# Patient Record
Sex: Female | Born: 1970 | Race: White | Hispanic: No | Marital: Single | Smoking: Never smoker
Health system: Southern US, Community
[De-identification: ages and names within clinical notes are randomized; demographics above are authoritative.]

## PROBLEM LIST (undated history)

## (undated) DIAGNOSIS — I10 Essential (primary) hypertension: Secondary | ICD-10-CM

## (undated) DIAGNOSIS — E039 Hypothyroidism, unspecified: Secondary | ICD-10-CM

## (undated) DIAGNOSIS — E669 Obesity, unspecified: Secondary | ICD-10-CM

## (undated) DIAGNOSIS — E785 Hyperlipidemia, unspecified: Secondary | ICD-10-CM

## (undated) HISTORY — DX: Obesity, unspecified: E66.9

## (undated) HISTORY — PX: TONSILLECTOMY: SUR1361

## (undated) HISTORY — PX: CHOLECYSTECTOMY: SHX55

## (undated) HISTORY — DX: Essential (primary) hypertension: I10

## (undated) HISTORY — DX: Hypothyroidism, unspecified: E03.9

## (undated) HISTORY — PX: ANKLE SURGERY: SHX546

## (undated) HISTORY — DX: Hyperlipidemia, unspecified: E78.5

---

## 2009-11-28 ENCOUNTER — Ambulatory Visit: Payer: Self-pay | Admitting: General Practice

## 2010-03-12 ENCOUNTER — Encounter: Admission: RE | Admit: 2010-03-12 | Discharge: 2010-03-12 | Payer: Self-pay | Admitting: Neurosurgery

## 2010-03-28 ENCOUNTER — Encounter: Admission: RE | Admit: 2010-03-28 | Discharge: 2010-03-28 | Payer: Self-pay | Admitting: Neurosurgery

## 2010-05-29 ENCOUNTER — Emergency Department (HOSPITAL_COMMUNITY): Admission: EM | Admit: 2010-05-29 | Discharge: 2010-05-29 | Payer: Self-pay | Admitting: Emergency Medicine

## 2014-12-04 ENCOUNTER — Emergency Department: Payer: Self-pay | Admitting: Emergency Medicine

## 2014-12-06 ENCOUNTER — Other Ambulatory Visit (HOSPITAL_COMMUNITY): Payer: Self-pay | Admitting: Physician Assistant

## 2014-12-06 DIAGNOSIS — M5126 Other intervertebral disc displacement, lumbar region: Secondary | ICD-10-CM

## 2014-12-06 DIAGNOSIS — M5410 Radiculopathy, site unspecified: Secondary | ICD-10-CM

## 2014-12-12 ENCOUNTER — Ambulatory Visit (HOSPITAL_COMMUNITY)
Admission: RE | Admit: 2014-12-12 | Discharge: 2014-12-12 | Disposition: A | Discharge status: Home / Self Care | Payer: 59 | Source: Ambulatory Visit | Attending: Physician Assistant | Admitting: Physician Assistant

## 2014-12-12 DIAGNOSIS — M5410 Radiculopathy, site unspecified: Secondary | ICD-10-CM

## 2014-12-12 DIAGNOSIS — M5126 Other intervertebral disc displacement, lumbar region: Secondary | ICD-10-CM

## 2015-07-27 ENCOUNTER — Encounter: Payer: Self-pay | Admitting: Physician Assistant

## 2015-07-27 ENCOUNTER — Ambulatory Visit: Payer: Self-pay | Admitting: Physician Assistant

## 2015-07-27 VITALS — BP 150/112 | Temp 98.8°F

## 2015-07-27 DIAGNOSIS — J069 Acute upper respiratory infection, unspecified: Secondary | ICD-10-CM

## 2015-07-27 DIAGNOSIS — R030 Elevated blood-pressure reading, without diagnosis of hypertension: Secondary | ICD-10-CM

## 2015-07-27 DIAGNOSIS — J018 Other acute sinusitis: Secondary | ICD-10-CM

## 2015-07-27 DIAGNOSIS — IMO0001 Reserved for inherently not codable concepts without codable children: Secondary | ICD-10-CM

## 2015-07-27 MED ORDER — FLUTICASONE PROPIONATE 50 MCG/ACT NA SUSP: 2.0000 | Freq: Every day | NASAL | Status: DC

## 2015-07-27 MED ORDER — AZITHROMYCIN 250 MG PO TABS: ORAL_TABLET | ORAL | Status: DC

## 2015-07-27 NOTE — Progress Notes (Signed)
S: C/o runny nose and congestion for 3 days, no fever, chills, cp/sob, v/d; mucus is green and thick, cough is sporadic, c/o of facial and dental pain. Earlier in the week she had diarrhea but is better now  Using otc meds: alka seltzer severe cold, zytrec, benadryl, tylenol and motrin  O: PE: vitals w elevated bp, others wnl, perrl eomi, normocephalic, tms dull, nasal mucosa red and swollen, throat injected, neck supple no lymph, lungs c t a, cv rrr, neuro intact  A:  Acute sinusitis, elevated blood pressure without dx of htn   P: zpack, flonase, stop otc meds, work note given, return for bp recheck on Mon or Wed, go to ER if bp getting higher over the weekend, drink fluids, continue regular meds , use otc meds of choice, return if not improving in 5 days, return earlier if worsening

## 2015-07-27 NOTE — Patient Instructions (Signed)
Sinusitis, Adult Sinusitis is redness, soreness, and inflammation of the paranasal sinuses. Paranasal sinuses are air pockets within the bones of your face. They are located beneath your eyes, in the middle of your forehead, and above your eyes. In healthy paranasal sinuses, mucus is able to drain out, and air is able to circulate through them by way of your nose. However, when your paranasal sinuses are inflamed, mucus and air can become trapped. This can allow bacteria and other germs to grow and cause infection. Sinusitis can develop quickly and last only a short time (acute) or continue over a long period (chronic). Sinusitis that lasts for more than 12 weeks is considered chronic. CAUSES Causes of sinusitis include:  Allergies.  Structural abnormalities, such as displacement of the cartilage that separates your nostrils (deviated septum), which can decrease the air flow through your nose and sinuses and affect sinus drainage.  Functional abnormalities, such as when the small hairs (cilia) that line your sinuses and help remove mucus do not work properly or are not present. SIGNS AND SYMPTOMS Symptoms of acute and chronic sinusitis are the same. The primary symptoms are pain and pressure around the affected sinuses. Other symptoms include:  Upper toothache.  Earache.  Headache.  Bad breath.  Decreased sense of smell and taste.  A cough, which worsens when you are lying flat.  Fatigue.  Fever.  Thick drainage from your nose, which often is green and may contain pus (purulent).  Swelling and warmth over the affected sinuses. DIAGNOSIS Your health care provider will perform a physical exam. During your exam, your health care provider may perform any of the following to help determine if you have acute sinusitis or chronic sinusitis:  Look in your nose for signs of abnormal growths in your nostrils (nasal polyps).  Tap over the affected sinus to check for signs of  infection.  View the inside of your sinuses using an imaging device that has a light attached (endoscope). If your health care provider suspects that you have chronic sinusitis, one or more of the following tests may be recommended:  Allergy tests.  Nasal culture. A sample of mucus is taken from your nose, sent to a lab, and screened for bacteria.  Nasal cytology. A sample of mucus is taken from your nose and examined by your health care provider to determine if your sinusitis is related to an allergy. TREATMENT Most cases of acute sinusitis are related to a viral infection and will resolve on their own within 10 days. Sometimes, medicines are prescribed to help relieve symptoms of both acute and chronic sinusitis. These may include pain medicines, decongestants, nasal steroid sprays, or saline sprays. However, for sinusitis related to a bacterial infection, your health care provider will prescribe antibiotic medicines. These are medicines that will help kill the bacteria causing the infection. Rarely, sinusitis is caused by a fungal infection. In these cases, your health care provider will prescribe antifungal medicine. For some cases of chronic sinusitis, surgery is needed. Generally, these are cases in which sinusitis recurs more than 3 times per year, despite other treatments. HOME CARE INSTRUCTIONS  Drink plenty of water. Water helps thin the mucus so your sinuses can drain more easily.  Use a humidifier.  Inhale steam 3-4 times a day (for example, sit in the bathroom with the shower running).  Apply a warm, moist washcloth to your face 3-4 times a day, or as directed by your health care provider.  Use saline nasal sprays to help   moisten and clean your sinuses.  Take medicines only as directed by your health care provider.  If you were prescribed either an antibiotic or antifungal medicine, finish it all even if you start to feel better. SEEK IMMEDIATE MEDICAL CARE IF:  You have  increasing pain or severe headaches.  You have nausea, vomiting, or drowsiness.  You have swelling around your face.  You have vision problems.  You have a stiff neck.  You have difficulty breathing.   This information is not intended to replace advice given to you by your health care provider. Make sure you discuss any questions you have with your health care provider.   Document Released: 09/22/2005 Document Revised: 10/13/2014 Document Reviewed: 10/07/2011 Elsevier Interactive Patient Education 2016 [employer]. Hypertension Hypertension, commonly called high blood pressure, is when the force of blood pumping through your arteries is too strong. Your arteries are the blood vessels that carry blood from your heart throughout your body. A blood pressure reading consists of a higher number over a lower number, such as 110/72. The higher number (systolic) is the pressure inside your arteries when your heart pumps. The lower number (diastolic) is the pressure inside your arteries when your heart relaxes. Ideally you want your blood pressure below 120/80. Hypertension forces your heart to work harder to pump blood. Your arteries may become narrow or stiff. Having untreated or uncontrolled hypertension can cause heart attack, stroke, kidney disease, and other problems. RISK FACTORS Some risk factors for high blood pressure are controllable. Others are not.  Risk factors you cannot control include:   Race. You may be at higher risk if you are African American.  Age. Risk increases with age.  Gender. Men are at higher risk than women before age 65 years. After age 35, women are at higher risk than men. Risk factors you can control include:  Not getting enough exercise or physical activity.  Being overweight.  Getting too much fat, sugar, calories, or salt in your diet.  Drinking too much alcohol. SIGNS AND SYMPTOMS Hypertension does not usually cause signs or symptoms. Extremely  high blood pressure (hypertensive crisis) may cause headache, anxiety, shortness of breath, and nosebleed. DIAGNOSIS To check if you have hypertension, your health care provider will measure your blood pressure while you are seated, with your arm held at the level of your heart. It should be measured at least twice using the same arm. Certain conditions can cause a difference in blood pressure between your right and left arms. A blood pressure reading that is higher than normal on one occasion does not mean that you need treatment. If it is not clear whether you have high blood pressure, you may be asked to return on a different day to have your blood pressure checked again. Or, you may be asked to monitor your blood pressure at home for 1 or more weeks. TREATMENT Treating high blood pressure includes making lifestyle changes and possibly taking medicine. Living a healthy lifestyle can help lower high blood pressure. You may need to change some of your habits. Lifestyle changes may include:  Following the DASH diet. This diet is high in fruits, vegetables, and whole grains. It is low in salt, red meat, and added sugars.  Keep your sodium intake below 2,300 mg per day.  Getting at least 30-45 minutes of aerobic exercise at least 4 times per week.  Losing weight if necessary.  Not smoking.  Limiting alcoholic beverages.  Learning ways to reduce stress. Your health  care provider may prescribe medicine if lifestyle changes are not enough to get your blood pressure under control, and if one of the following is true:  You are 21-64 years of age and your systolic blood pressure is above 140.  You are 77 years of age or older, and your systolic blood pressure is above 150.  Your diastolic blood pressure is above 90.  You have diabetes, and your systolic blood pressure is over 818 or your diastolic blood pressure is over 90.  You have kidney disease and your blood pressure is above  140/90.  You have heart disease and your blood pressure is above 140/90. Your personal target blood pressure may vary depending on your medical conditions, your age, and other factors. HOME CARE INSTRUCTIONS  Have your blood pressure rechecked as directed by your health care provider.   Take medicines only as directed by your health care provider. Follow the directions carefully. Blood pressure medicines must be taken as prescribed. The medicine does not work as well when you skip doses. Skipping doses also puts you at risk for problems.  Do not smoke.   Monitor your blood pressure at home as directed by your health care provider. SEEK MEDICAL CARE IF:   You think you are having a reaction to medicines taken.  You have recurrent headaches or feel dizzy.  You have swelling in your ankles.  You have trouble with your vision. SEEK IMMEDIATE MEDICAL CARE IF:  You develop a severe headache or confusion.  You have unusual weakness, numbness, or feel faint.  You have severe chest or abdominal pain.  You vomit repeatedly.  You have trouble breathing. MAKE SURE YOU:   Understand these instructions.  Will watch your condition.  Will get help right away if you are not doing well or get worse.   This information is not intended to replace advice given to you by your health care provider. Make sure you discuss any questions you have with your health care provider.   Document Released: 09/22/2005 Document Revised: 02/06/2015 Document Reviewed: 07/15/2013 Elsevier Interactive Patient Education 2016 [employer] Your High Blood Pressure Blood pressure is a measurement of how forceful your blood is pressing against the walls of the arteries. Arteries are muscular tubes within the circulatory system. Blood pressure does not stay the same. Blood pressure rises when you are active, excited, or nervous; and it lowers during sleep and relaxation. If the numbers measuring your  blood pressure stay above normal most of the time, you are at risk for health problems. High blood pressure (hypertension) is a long-term (chronic) condition in which blood pressure is elevated. A blood pressure reading is recorded as two numbers, such as 120 over 80 (or 120/80). The first, higher number is called the systolic pressure. It is a measure of the pressure in your arteries as the heart beats. The second, lower number is called the diastolic pressure. It is a measure of the pressure in your arteries as the heart relaxes between beats.  Keeping your blood pressure in a normal range is important to your overall health and prevention of health problems, such as heart disease and stroke. When your blood pressure is uncontrolled, your heart has to work harder than normal. High blood pressure is a very common condition in adults because blood pressure tends to rise with age. Men and women are equally likely to have hypertension but at different times in life. Before age 52, men are more likely to have hypertension.  After 43 years of age, women are more likely to have it. Hypertension is especially common in African Americans. This condition often has no signs or symptoms. The cause of the condition is usually not known. Your caregiver can help you come up with a plan to keep your blood pressure in a normal, healthy range. BLOOD PRESSURE STAGES Blood pressure is classified into four stages: normal, prehypertension, stage 1, and stage 2. Your blood pressure reading will be used to determine what type of treatment, if any, is necessary. Appropriate treatment options are tied to these four stages:  Normal  Systolic pressure (mm Hg): below 120.  Diastolic pressure (mm Hg): below 80. Prehypertension  Systolic pressure (mm Hg): 120 to 139.  Diastolic pressure (mm Hg): 80 to 89. Stage1  Systolic pressure (mm Hg): 140 to 159.  Diastolic pressure (mm Hg): 90 to 99. Stage2  Systolic pressure (mm  Hg): 160 or above.  Diastolic pressure (mm Hg): 100 or above. RISKS RELATED TO HIGH BLOOD PRESSURE Managing your blood pressure is an important responsibility. Uncontrolled high blood pressure can lead to:  A heart attack.  A stroke.  A weakened blood vessel (aneurysm).  Heart failure.  Kidney damage.  Eye damage.  Metabolic syndrome.  Memory and concentration problems. HOW TO MANAGE YOUR BLOOD PRESSURE Blood pressure can be managed effectively with lifestyle changes and medicines (if needed). Your caregiver will help you come up with a plan to bring your blood pressure within a normal range. Your plan should include the following: Education  Read all information provided by your caregivers about how to control blood pressure.  Educate yourself on the latest guidelines and treatment recommendations. New research is always being done to further define the risks and treatments for high blood pressure. Lifestylechanges  Control your weight.  Avoid smoking.  Stay physically active.  Reduce the amount of salt in your diet.  Reduce stress.  Control any chronic conditions, such as high cholesterol or diabetes.  Reduce your alcohol intake. Medicines  Several medicines (antihypertensive medicines) are available, if needed, to bring blood pressure within a normal range. Communication  Review all the medicines you take with your caregiver because there may be side effects or interactions.  Talk with your caregiver about your diet, exercise habits, and other lifestyle factors that may be contributing to high blood pressure.  See your caregiver regularly. Your caregiver can help you create and adjust your plan for managing high blood pressure. RECOMMENDATIONS FOR TREATMENT AND FOLLOW-UP  The following recommendations are based on current guidelines for managing high blood pressure in nonpregnant adults. Use these recommendations to identify the proper follow-up period or  treatment option based on your blood pressure reading. You can discuss these options with your caregiver.  Systolic pressure of 948 to 546 or diastolic pressure of 80 to 89: Follow up with your caregiver as directed.  Systolic pressure of 270 to 350 or diastolic pressure of 90 to 100: Follow up with your caregiver within 2 months.  Systolic pressure above 093 or diastolic pressure above 818: Follow up with your caregiver within 1 month.  Systolic pressure above 299 or diastolic pressure above 371: Consider antihypertensive therapy; follow up with your caregiver within 1 week.  Systolic pressure above 696 or diastolic pressure above 789: Begin antihypertensive therapy; follow up with your caregiver within 1 week.   This information is not intended to replace advice given to you by your health care provider. Make sure you discuss any questions you  have with your health care provider.   Document Released: 06/16/2012 Document Reviewed: 06/16/2012 Elsevier Interactive Patient Education [employer].

## 2015-07-30 ENCOUNTER — Ambulatory Visit: Payer: Self-pay | Admitting: Physician Assistant

## 2015-07-30 ENCOUNTER — Encounter: Payer: Self-pay | Admitting: Physician Assistant

## 2015-07-30 VITALS — BP 150/98

## 2015-07-30 DIAGNOSIS — Z0130 Encounter for examination of blood pressure without abnormal findings: Secondary | ICD-10-CM

## 2015-07-30 NOTE — Progress Notes (Signed)
S: recheck of bp, states cold sx are a little better but still gets headache from coughing, denies cp/sob/dizziness  O: bp still elevated but a little better, 150/98; lungs c t a, cv rrr, neuro intact  A: elevated bp without diagnoses of htn  P: recheck bp on Thursday morning, if still elevated will start pt on medication

## 2015-08-02 ENCOUNTER — Encounter: Payer: Self-pay | Admitting: Physician Assistant

## 2015-08-02 ENCOUNTER — Ambulatory Visit: Payer: Self-pay | Admitting: Physician Assistant

## 2015-08-02 VITALS — BP 140/90

## 2015-08-02 DIAGNOSIS — I10 Essential (primary) hypertension: Secondary | ICD-10-CM

## 2015-08-02 NOTE — Progress Notes (Signed)
S: here for bp check, has gone down to 140/90, pt is still coughing but feeling a little better, no cp/sob  O: bp 140/90, lungs c t a, cv rrr  A: recheck uri and bp  P: recheck bp next week, if still elevated will start on medication

## 2015-08-08 ENCOUNTER — Encounter: Payer: Self-pay | Admitting: Physician Assistant

## 2015-08-08 ENCOUNTER — Ambulatory Visit: Payer: Self-pay | Admitting: Physician Assistant

## 2015-08-08 VITALS — BP 124/90 | Temp 98.0°F

## 2015-08-08 DIAGNOSIS — J018 Other acute sinusitis: Secondary | ICD-10-CM

## 2015-08-08 MED ORDER — AMOXICILLIN 875 MG PO TABS: 875.0000 mg | ORAL_TABLET | Freq: Two times a day (BID) | ORAL | Status: DC

## 2015-08-08 NOTE — Progress Notes (Signed)
S: here for bp recheck, still coughing and having a lot of drainage, no fever/chills, mucus is green in the mornings and then clears throughout the day, no cp/sob  O: bp 124/90, improved, tms clear, nasal mucosa grossly red and swollen, throat wnl, neck supple no lymph, lungs c t a, cv rrr, cough is dry  A: acute sinusitis, bp recheck  P: amoxil 875mg  bid x 10d, recheck bp in 1 week,

## 2015-08-16 ENCOUNTER — Encounter: Payer: Self-pay | Admitting: Physician Assistant

## 2015-08-16 ENCOUNTER — Ambulatory Visit: Payer: Self-pay | Admitting: Physician Assistant

## 2015-08-16 VITALS — BP 122/88 | Temp 98.8°F

## 2015-08-16 DIAGNOSIS — J209 Acute bronchitis, unspecified: Secondary | ICD-10-CM

## 2015-08-16 DIAGNOSIS — R059 Cough, unspecified: Secondary | ICD-10-CM

## 2015-08-16 DIAGNOSIS — R05 Cough: Secondary | ICD-10-CM

## 2015-08-16 MED ORDER — BENZONATATE 200 MG PO CAPS: 200.0000 mg | ORAL_CAPSULE | Freq: Two times a day (BID) | ORAL | Status: DC | PRN

## 2015-08-16 MED ORDER — METHYLPREDNISOLONE 4 MG PO TBPK: ORAL_TABLET | Freq: Every morning | ORAL | Status: DC

## 2015-08-16 MED ORDER — ALBUTEROL SULFATE HFA 108 (90 BASE) MCG/ACT IN AERS: 2.0000 | INHALATION_SPRAY | Freq: Four times a day (QID) | RESPIRATORY_TRACT | Status: DC | PRN

## 2015-08-16 NOTE — Progress Notes (Signed)
S: continued cough, no fever/chills, mucus is clear, still taking amoxil, no cp/sob, cough is spasmodic, using otc meds without relief, cough drops, has had zpack, now amoxil   O: vitals wnl, nad, ENT wnl, neck supple no lymph, lungs c t a, cv rrr, cough is dry and hacking  A: acute bronchitis  P: albuterol inhaler, medrol dose pack, tessalon perls

## 2015-08-23 ENCOUNTER — Other Ambulatory Visit
Admission: RE | Admit: 2015-08-23 | Discharge: 2015-08-23 | Disposition: A | Discharge status: Home / Self Care | Payer: 59 | Source: Ambulatory Visit | Attending: Certified Nurse Midwife | Admitting: Certified Nurse Midwife

## 2015-08-23 DIAGNOSIS — R300 Dysuria: Secondary | ICD-10-CM | POA: Insufficient documentation

## 2015-08-23 LAB — URINALYSIS COMPLETE WITH MICROSCOPIC (ARMC ONLY)
BILIRUBIN URINE: NEGATIVE
GLUCOSE, UA: NEGATIVE mg/dL
HGB URINE DIPSTICK: NEGATIVE
Ketones, ur: NEGATIVE mg/dL
NITRITE: NEGATIVE
Protein, ur: NEGATIVE mg/dL
SPECIFIC GRAVITY, URINE: 1.014 (ref 1.005–1.030)
pH: 6 (ref 5.0–8.0)

## 2015-08-25 ENCOUNTER — Other Ambulatory Visit: Payer: Self-pay | Admitting: Certified Nurse Midwife

## 2015-08-25 LAB — URINE CULTURE: Culture: >=100000

## 2015-09-11 ENCOUNTER — Encounter: Payer: Self-pay | Admitting: Physician Assistant

## 2015-09-11 ENCOUNTER — Ambulatory Visit: Payer: Self-pay | Admitting: Physician Assistant

## 2015-09-11 VITALS — BP 130/90 | HR 76 | Temp 98.6°F

## 2015-09-11 DIAGNOSIS — J018 Other acute sinusitis: Secondary | ICD-10-CM

## 2015-09-11 MED ORDER — FLUTICASONE PROPIONATE 50 MCG/ACT NA SUSP: 2.0000 | Freq: Every day | NASAL | Status: DC

## 2015-09-11 MED ORDER — CEFDINIR 300 MG PO CAPS: 300.0000 mg | ORAL_CAPSULE | Freq: Two times a day (BID) | ORAL | Status: DC

## 2015-09-11 MED ORDER — FLUCONAZOLE 150 MG PO TABS: 150.0000 mg | ORAL_TABLET | Freq: Once | ORAL | Status: DC

## 2015-09-11 NOTE — Progress Notes (Signed)
S: C/o runny nose and congestion for 6 days, no fever, chills, cp/sob, v/d; mucus is green and thick, cough is sporadic, c/o of facial and dental pain.   Using otc meds:   O: PE: vitals wnl, nad, perrl eomi, normocephalic, tms dull, nasal mucosa red and swollen, throat injected, neck supple no lymph, lungs c t a, cv rrr, neuro intact  A:  Acute sinusitis   P: doxy 100mg  bid x 10d, flonase, diflucan if needed, change allergy med to allegra instead of zyrtec, drink fluids, continue regular meds , use otc meds of choice, return if not improving in 5 days, return earlier if worsening

## 2015-10-12 ENCOUNTER — Ambulatory Visit: Payer: Self-pay | Admitting: Physician Assistant

## 2015-10-12 ENCOUNTER — Encounter: Payer: Self-pay | Admitting: Physician Assistant

## 2015-10-12 VITALS — BP 130/98 | HR 60 | Temp 98.6°F

## 2015-10-12 DIAGNOSIS — J0181 Other acute recurrent sinusitis: Secondary | ICD-10-CM

## 2015-10-12 MED ORDER — CLARITHROMYCIN ER 500 MG PO TB24: 1000.0000 mg | ORAL_TABLET | Freq: Every day | ORAL | Status: DC

## 2015-10-12 MED ORDER — FLUCONAZOLE 150 MG PO TABS: 150.0000 mg | ORAL_TABLET | Freq: Once | ORAL | Status: DC

## 2015-10-12 NOTE — Progress Notes (Signed)
S: c/o continued sinus congestion, yellow mucus, cough, cough is better, states she feels better she just can't get rid of sinus drianage, no fever/chills/cp/sob, has been on zpack, amoxil, omnicef, keflex, then doxy in past 3 months,   O: vitals wnl, nad, tms dull, nasal mucosa grossly red and swollen, tender at max sinus, throat wnl, neck supple no lymph, lungs c t a, cv rrr  A: recurrent sinusitis  P: biaxin xl , if not better after this medication, refer to ENT

## 2017-01-05 ENCOUNTER — Encounter (HOSPITAL_COMMUNITY): Payer: Self-pay | Admitting: Emergency Medicine

## 2017-01-05 ENCOUNTER — Emergency Department (HOSPITAL_COMMUNITY): Payer: 59

## 2017-01-05 ENCOUNTER — Emergency Department (HOSPITAL_COMMUNITY)
Admission: EM | Admit: 2017-01-05 | Discharge: 2017-01-06 | Disposition: A | Discharge status: Home / Self Care | Payer: 59 | Attending: Emergency Medicine | Admitting: Emergency Medicine

## 2017-01-05 DIAGNOSIS — R0789 Other chest pain: Secondary | ICD-10-CM | POA: Insufficient documentation

## 2017-01-05 DIAGNOSIS — D3500 Benign neoplasm of unspecified adrenal gland: Secondary | ICD-10-CM | POA: Insufficient documentation

## 2017-01-05 DIAGNOSIS — Z79899 Other long term (current) drug therapy: Secondary | ICD-10-CM | POA: Diagnosis not present

## 2017-01-05 DIAGNOSIS — R079 Chest pain, unspecified: Secondary | ICD-10-CM | POA: Diagnosis not present

## 2017-01-05 DIAGNOSIS — D3502 Benign neoplasm of left adrenal gland: Secondary | ICD-10-CM

## 2017-01-05 LAB — CBC WITH DIFFERENTIAL/PLATELET
Basophils Absolute: 0 10*3/uL (ref 0.0–0.1)
Basophils Relative: 0 %
EOS PCT: 2 %
Eosinophils Absolute: 0.2 10*3/uL (ref 0.0–0.7)
HCT: 42.5 % (ref 36.0–46.0)
HEMOGLOBIN: 14.1 g/dL (ref 12.0–15.0)
LYMPHS PCT: 38 %
Lymphs Abs: 3.3 10*3/uL (ref 0.7–4.0)
MCH: 30.2 pg (ref 26.0–34.0)
MCHC: 33.2 g/dL (ref 30.0–36.0)
MCV: 91 fL (ref 78.0–100.0)
MONO ABS: 0.5 10*3/uL (ref 0.1–1.0)
MONOS PCT: 6 %
NEUTROS ABS: 4.6 10*3/uL (ref 1.7–7.7)
Neutrophils Relative %: 54 %
Platelets: 234 10*3/uL (ref 150–400)
RBC: 4.67 MIL/uL (ref 3.87–5.11)
RDW: 13.4 % (ref 11.5–15.5)
WBC: 8.5 10*3/uL (ref 4.0–10.5)

## 2017-01-05 LAB — BASIC METABOLIC PANEL
ANION GAP: 9 (ref 5–15)
BUN: 10 mg/dL (ref 6–20)
CHLORIDE: 104 mmol/L (ref 101–111)
CO2: 24 mmol/L (ref 22–32)
Calcium: 7.9 mg/dL — ABNORMAL LOW (ref 8.9–10.3)
Creatinine, Ser: 0.75 mg/dL (ref 0.44–1.00)
GFR calc non Af Amer: >60 mL/min (ref >60)
Glucose, Bld: 102 mg/dL — ABNORMAL HIGH (ref 65–99)
POTASSIUM: 3.9 mmol/L (ref 3.5–5.1)
SODIUM: 137 mmol/L (ref 135–145)

## 2017-01-05 LAB — TROPONIN I

## 2017-01-05 NOTE — ED Triage Notes (Signed)
Pt c/o intermittent cp to under left breast radiating up to left shoulder. States it is sharp and aching. Worse with deep breathing. Some movement makes it worse.

## 2017-01-05 NOTE — ED Provider Notes (Signed)
[facility] Provider Note   CSN: [id number] Arrival date & time: 01/05/17  1738  By signing my name below, I, Margit Banda, attest that this documentation has been prepared under the direction and in the presence of Merryl Hacker, MD. Electronically Signed: Margit Banda, ED Scribe. 01/05/17. 11:51 PM.   History   Chief Complaint Chief Complaint  Patient presents with  . Chest Pain    HPI Jasmine Wallace is a 46 y.o. female who presents to the Emergency Department complaining of constant, radiating, abdominal pain since 5:30 am on 01/05/17. Breathing exacerbates her pain causing it to extend upwards to her chest. Pt states Ibuprofen mildly alleviates her pain. Pt rates pain as a 5/10 severity. No recent injuries or traumas. Pt notes taking benadryl daily for allergies. She denies no nausea, vomiting, diarrhea, and any other associated sx at this time.    The history is provided by the patient. No language interpreter was used.    History reviewed. No pertinent past medical history.  There are no active problems to display for this patient.   Past Surgical History:  Procedure Laterality Date  . ANKLE SURGERY    . CHOLECYSTECTOMY    . TONSILLECTOMY      OB History    No data available       Home Medications    Prior to Admission medications   Medication Sig Start Date End Date Taking? Authorizing Provider  acetaminophen (TYLENOL) 500 MG tablet Take 500 mg by mouth every 6 (six) hours as needed.    Historical Provider, MD  albuterol (PROVENTIL HFA;VENTOLIN HFA) 108 (90 BASE) MCG/ACT inhaler Inhale 2 puffs into the lungs every 6 (six) hours as needed for wheezing or shortness of breath. 08/16/15   Versie Starks, PA-C  cefdinir (OMNICEF) 300 MG capsule Take 1 capsule (300 mg total) by mouth 2 (two) times daily. Patient not taking: Reported on 10/12/2015 09/11/15   Versie Starks, PA-C  cetirizine (ZYRTEC) 10 MG tablet Take 10 mg by mouth daily. Reported on  10/12/2015    Historical Provider, MD  clarithromycin (BIAXIN XL) 500 MG 24 hr tablet Take 2 tablets (1,000 mg total) by mouth daily. 10/12/15   Versie Starks, PA-C  fexofenadine (ALLEGRA) 180 MG tablet Take 180 mg by mouth daily.    Historical Provider, MD  fluconazole (DIFLUCAN) 150 MG tablet Take 1 tablet (150 mg total) by mouth once. 10/12/15   Versie Starks, PA-C  fluticasone (FLONASE) 50 MCG/ACT nasal spray Place 2 sprays into both nostrils daily. 09/11/15   Versie Starks, PA-C  omeprazole (PRILOSEC) 20 MG capsule Take 1 capsule (20 mg total) by mouth daily. 01/06/17   Merryl Hacker, MD  traMADol (ULTRAM) 50 MG tablet Take 1 tablet (50 mg total) by mouth every 6 (six) hours as needed. 01/06/17   Merryl Hacker, MD    Family History History reviewed. No pertinent family history.  Social History Social History  Substance Use Topics  . Smoking status: Never Smoker  . Smokeless tobacco: Never Used  . Alcohol use No     Allergies   Patient has no known allergies.   Review of Systems Review of Systems  Constitutional: Negative for fever.  Respiratory: Negative for cough and shortness of breath.   Cardiovascular: Positive for chest pain.  Gastrointestinal: Positive for abdominal pain. Negative for diarrhea, nausea and vomiting.  All other systems reviewed and are negative.    Physical Exam Updated Vital Signs  BP (!) 155/78 (BP Location: Right Arm)   Pulse 84   Temp 98.7 F (37.1 C) (Oral)   Resp 18   Ht 5\' 3"  (1.6 m)   Wt 300 lb (136.1 kg)   LMP 12/30/2016   SpO2 98%   BMI 53.14 kg/m   Physical Exam  Constitutional: She is oriented to person, place, and time. She appears well-developed and well-nourished.  Diabetes, no acute distress  HENT:  Head: Normocephalic and atraumatic.  Cardiovascular: Normal rate, regular rhythm and normal heart sounds.   Pulmonary/Chest: Effort normal. No respiratory distress. She has no wheezes. She exhibits no tenderness.  Abdominal:  Soft. Bowel sounds are normal. There is tenderness. There is no rebound and no guarding.  Lower rib cage, left upper abdominal venous palpation, no rebound or guarding  Musculoskeletal: She exhibits no edema.  Neurological: She is alert and oriented to person, place, and time.  Skin: Skin is warm and dry.  Psychiatric: She has a normal mood and affect.  Nursing note and vitals reviewed.    ED Treatments / Results  DIAGNOSTIC STUDIES: Oxygen Saturation is 100% on RA, normal by my interpretation.   COORDINATION OF CARE: 11:51 PM-Discussed next steps with pt. Pt verbalized understanding and is agreeable with the plan.    Labs (all labs ordered are listed, but only abnormal results are displayed) Labs Reviewed  BASIC METABOLIC PANEL - Abnormal; Notable for the following:       Result Value   Glucose, Bld 102 (*)    Calcium 7.9 (*)    All other components within normal limits  D-DIMER, QUANTITATIVE (NOT AT [facility]) - Abnormal; Notable for the following:    D-Dimer, Quant 1.03 (*)    All other components within normal limits  CBC WITH DIFFERENTIAL/PLATELET  TROPONIN I  HEPATIC FUNCTION PANEL  LIPASE, BLOOD    EKG  EKG Interpretation  Date/Time:  Monday January 05 2017 17:50:13 EDT Ventricular Rate:  77 PR Interval:  136 QRS Duration: 88 QT Interval:  400 QTC Calculation: 452 R Axis:   26 Text Interpretation:  Normal sinus rhythm Normal ECG Confirmed by Dina Rich  MD, COURTNEY ([id number]) on 01/05/2017 11:36:37 PM       Radiology Dg Chest 2 View  Result Date: 01/05/2017 CLINICAL DATA:  Left chest pain. EXAM: CHEST  2 VIEW COMPARISON:  None. FINDINGS: The heart size and mediastinal contours are within normal limits. Both lungs are clear. The visualized skeletal structures are unremarkable. IMPRESSION: No active cardiopulmonary disease. Electronically Signed   By: Abelardo Diesel M.D.   On: 01/05/2017 18:18   Ct Angio Chest Pe W And/or Wo Contrast  Result Date: 01/06/2017 CLINICAL  DATA:  46 y/o F; pleuritic chest pain with positive D-dimer. EXAM: CT ANGIOGRAPHY CHEST WITH CONTRAST TECHNIQUE: Multidetector CT imaging of the chest was performed using the standard protocol during bolus administration of intravenous contrast. Multiplanar CT image reconstructions and MIPs were obtained to evaluate the vascular anatomy. CONTRAST:  100 cc Omnipaque 370 COMPARISON:  None. FINDINGS: Cardiovascular: Satisfactory opacification of the pulmonary arteries to the segmental level. No evidence of pulmonary embolism. Normal heart size. No pericardial effusion. Mediastinum/Nodes: No enlarged mediastinal, hilar, or axillary lymph nodes. Thyroid gland, trachea, and esophagus demonstrate no significant findings. Lungs/Pleura: Lungs are clear. No pleural effusion or pneumothorax. Upper Abdomen: 18 mm and -3 HU left adrenal nodule compatible with adrenal adenoma. Musculoskeletal: No chest wall abnormality. Mild multilevel degenerative changes of the spine. Review of the MIP images confirms the  above findings. IMPRESSION: 1. No evidence of pulmonary embolus. 2. No acute cardiopulmonary process identified. 3. 18 mm left adrenal adenoma. Electronically Signed   By: Kristine Garbe M.D.   On: 01/06/2017 02:50    Procedures Procedures (including critical care time)  Medications Ordered in ED Medications  oxyCODONE-acetaminophen (PERCOCET/ROXICET) 5-325 MG per tablet 1 tablet (1 tablet Oral Given 01/06/17 0022)  iopamidol (ISOVUE-370) 76 % injection 100 mL (100 mLs Intravenous Contrast Given 01/06/17 0205)     Initial Impression / Assessment and Plan / ED Course  I have reviewed the triage vital signs and the nursing notes.  Pertinent labs & imaging results that were available during my care of the patient were reviewed by me and considered in my medical decision making (see chart for details).     She presents with pain that radiates into her chest. Initially had a chest pain evaluation started  in triage. Her EKG and troponin are reassuring. Pain is pleuritic in nature. She also has some minimal abdominal tenderness on exam. Abdominal labs were obtained. D-dimer was also obtained. D-dimer is positive. CT angiogram of the chest obtained. Abdominal lab work is reassuring. Patient improved with pain medication. CT scan shows no evidence of PE but does show an incidental left adrenal adenoma. Unclear the significance of this at this time. I did discuss with the patient this finding. Will discharge home with pain medication. Follow-up cardiology primary physician.  After history, exam, and medical workup I feel the patient has been appropriately medically screened and is safe for discharge home. Pertinent diagnoses were discussed with the patient. Patient was given return precautions.   Final Clinical Impressions(s) / ED Diagnoses   Final diagnoses:  Atypical chest pain  Adenoma of left adrenal gland    New Prescriptions New Prescriptions   OMEPRAZOLE (PRILOSEC) 20 MG CAPSULE    Take 1 capsule (20 mg total) by mouth daily.   TRAMADOL (ULTRAM) 50 MG TABLET    Take 1 tablet (50 mg total) by mouth every 6 (six) hours as needed.   I personally performed the services described in this documentation, which was scribed in my presence. The recorded information has been reviewed and is accurate.     Merryl Hacker, MD 01/06/17 [phone]

## 2017-01-06 ENCOUNTER — Emergency Department (HOSPITAL_COMMUNITY): Payer: 59

## 2017-01-06 DIAGNOSIS — D3500 Benign neoplasm of unspecified adrenal gland: Secondary | ICD-10-CM | POA: Diagnosis not present

## 2017-01-06 DIAGNOSIS — R079 Chest pain, unspecified: Secondary | ICD-10-CM | POA: Diagnosis not present

## 2017-01-06 DIAGNOSIS — Z79899 Other long term (current) drug therapy: Secondary | ICD-10-CM | POA: Diagnosis not present

## 2017-01-06 DIAGNOSIS — R0789 Other chest pain: Secondary | ICD-10-CM | POA: Diagnosis not present

## 2017-01-06 LAB — HEPATIC FUNCTION PANEL
ALK PHOS: 101 U/L (ref 38–126)
ALT: 23 U/L (ref 14–54)
AST: 23 U/L (ref 15–41)
Albumin: 3.9 g/dL (ref 3.5–5.0)
BILIRUBIN DIRECT: 0.1 mg/dL (ref 0.1–0.5)
BILIRUBIN TOTAL: 0.6 mg/dL (ref 0.3–1.2)
Indirect Bilirubin: 0.5 mg/dL (ref 0.3–0.9)
Total Protein: 7.3 g/dL (ref 6.5–8.1)

## 2017-01-06 LAB — D-DIMER, QUANTITATIVE (NOT AT ARMC): D DIMER QUANT: 1.03 ug{FEU}/mL — AB (ref 0.00–0.50)

## 2017-01-06 LAB — LIPASE, BLOOD: Lipase: 24 U/L (ref 11–51)

## 2017-01-06 MED ORDER — IOPAMIDOL (ISOVUE-370) INJECTION 76%
100.0000 mL | Freq: Once | INTRAVENOUS | Status: AC | PRN
  Administered 2017-01-06: 100 mL via INTRAVENOUS

## 2017-01-06 MED ORDER — TRAMADOL HCL 50 MG PO TABS: 50.0000 mg | ORAL_TABLET | Freq: Four times a day (QID) | ORAL | 0 refills | Status: DC | PRN

## 2017-01-06 MED ORDER — OMEPRAZOLE 20 MG PO CPDR: 20.0000 mg | DELAYED_RELEASE_CAPSULE | Freq: Every day | ORAL | 0 refills | Status: DC

## 2017-01-06 MED ORDER — OXYCODONE-ACETAMINOPHEN 5-325 MG PO TABS
1.0000 | ORAL_TABLET | Freq: Once | ORAL | Status: AC
Start: 2017-01-06 — End: 2017-01-06
  Administered 2017-01-06: 1 via ORAL
  Filled 2017-01-06: qty 1

## 2017-01-06 NOTE — ED Notes (Signed)
Pt ambulatory to waiting room. Pt verbalized understanding of discharge instructions.   

## 2017-01-06 NOTE — Discharge Instructions (Signed)
Seen today for chest and abdominal pain. Workup is largely reassuring. The cause of your pain is unknown at this time. Follow-up with cardiology. Have persistent symptoms. You'll be started on a acid reducer.  You were also incidentally found to have an adrenal adenoma. This is likely not related to your symptoms today. Follow-up with your primary physician.

## 2017-01-16 DIAGNOSIS — Z6841 Body Mass Index (BMI) 40.0 and over, adult: Secondary | ICD-10-CM | POA: Diagnosis not present

## 2017-01-16 DIAGNOSIS — Z1389 Encounter for screening for other disorder: Secondary | ICD-10-CM | POA: Diagnosis not present

## 2017-01-16 DIAGNOSIS — R1012 Left upper quadrant pain: Secondary | ICD-10-CM | POA: Diagnosis not present

## 2017-01-21 ENCOUNTER — Other Ambulatory Visit (HOSPITAL_COMMUNITY): Payer: Self-pay | Admitting: Internal Medicine

## 2017-01-21 DIAGNOSIS — Z1231 Encounter for screening mammogram for malignant neoplasm of breast: Secondary | ICD-10-CM

## 2017-01-23 ENCOUNTER — Ambulatory Visit (HOSPITAL_COMMUNITY)
Admission: RE | Admit: 2017-01-23 | Discharge: 2017-01-23 | Disposition: A | Discharge status: Home / Self Care | Payer: 59 | Source: Ambulatory Visit | Attending: Internal Medicine | Admitting: Internal Medicine

## 2017-01-23 DIAGNOSIS — Z1231 Encounter for screening mammogram for malignant neoplasm of breast: Secondary | ICD-10-CM | POA: Insufficient documentation

## 2017-04-10 DIAGNOSIS — Z01411 Encounter for gynecological examination (general) (routine) with abnormal findings: Secondary | ICD-10-CM | POA: Diagnosis not present

## 2017-04-10 DIAGNOSIS — M222X1 Patellofemoral disorders, right knee: Secondary | ICD-10-CM | POA: Diagnosis not present

## 2017-04-10 DIAGNOSIS — Z6841 Body Mass Index (BMI) 40.0 and over, adult: Secondary | ICD-10-CM | POA: Diagnosis not present

## 2017-04-10 DIAGNOSIS — Z0000 Encounter for general adult medical examination without abnormal findings: Secondary | ICD-10-CM | POA: Diagnosis not present

## 2017-04-10 DIAGNOSIS — Z1389 Encounter for screening for other disorder: Secondary | ICD-10-CM | POA: Diagnosis not present

## 2017-04-10 DIAGNOSIS — R87610 Atypical squamous cells of undetermined significance on cytologic smear of cervix (ASC-US): Secondary | ICD-10-CM | POA: Diagnosis not present

## 2017-07-13 DIAGNOSIS — M546 Pain in thoracic spine: Secondary | ICD-10-CM | POA: Diagnosis not present

## 2017-07-13 DIAGNOSIS — M9902 Segmental and somatic dysfunction of thoracic region: Secondary | ICD-10-CM | POA: Diagnosis not present

## 2017-07-13 DIAGNOSIS — M542 Cervicalgia: Secondary | ICD-10-CM | POA: Diagnosis not present

## 2017-07-13 DIAGNOSIS — M9901 Segmental and somatic dysfunction of cervical region: Secondary | ICD-10-CM | POA: Diagnosis not present

## 2017-07-17 DIAGNOSIS — M542 Cervicalgia: Secondary | ICD-10-CM | POA: Diagnosis not present

## 2017-07-17 DIAGNOSIS — M9901 Segmental and somatic dysfunction of cervical region: Secondary | ICD-10-CM | POA: Diagnosis not present

## 2017-07-17 DIAGNOSIS — M9902 Segmental and somatic dysfunction of thoracic region: Secondary | ICD-10-CM | POA: Diagnosis not present

## 2017-07-17 DIAGNOSIS — M546 Pain in thoracic spine: Secondary | ICD-10-CM | POA: Diagnosis not present

## 2017-07-20 DIAGNOSIS — M9901 Segmental and somatic dysfunction of cervical region: Secondary | ICD-10-CM | POA: Diagnosis not present

## 2017-07-20 DIAGNOSIS — M546 Pain in thoracic spine: Secondary | ICD-10-CM | POA: Diagnosis not present

## 2017-07-20 DIAGNOSIS — M542 Cervicalgia: Secondary | ICD-10-CM | POA: Diagnosis not present

## 2017-07-20 DIAGNOSIS — M9902 Segmental and somatic dysfunction of thoracic region: Secondary | ICD-10-CM | POA: Diagnosis not present

## 2017-07-27 DIAGNOSIS — M542 Cervicalgia: Secondary | ICD-10-CM | POA: Diagnosis not present

## 2017-07-27 DIAGNOSIS — M9902 Segmental and somatic dysfunction of thoracic region: Secondary | ICD-10-CM | POA: Diagnosis not present

## 2017-07-27 DIAGNOSIS — M9901 Segmental and somatic dysfunction of cervical region: Secondary | ICD-10-CM | POA: Diagnosis not present

## 2017-07-27 DIAGNOSIS — M546 Pain in thoracic spine: Secondary | ICD-10-CM | POA: Diagnosis not present

## 2017-08-03 DIAGNOSIS — M542 Cervicalgia: Secondary | ICD-10-CM | POA: Diagnosis not present

## 2017-08-03 DIAGNOSIS — M9902 Segmental and somatic dysfunction of thoracic region: Secondary | ICD-10-CM | POA: Diagnosis not present

## 2017-08-03 DIAGNOSIS — M9901 Segmental and somatic dysfunction of cervical region: Secondary | ICD-10-CM | POA: Diagnosis not present

## 2017-08-03 DIAGNOSIS — M546 Pain in thoracic spine: Secondary | ICD-10-CM | POA: Diagnosis not present

## 2017-11-27 ENCOUNTER — Ambulatory Visit: Payer: Self-pay | Admitting: Family

## 2017-11-27 ENCOUNTER — Encounter: Payer: Self-pay | Admitting: Family

## 2017-11-27 VITALS — BP 188/98 | HR 76 | Temp 97.9°F | Wt 310.0 lb

## 2017-11-27 DIAGNOSIS — R030 Elevated blood-pressure reading, without diagnosis of hypertension: Secondary | ICD-10-CM

## 2017-11-27 DIAGNOSIS — L2381 Allergic contact dermatitis due to animal (cat) (dog) dander: Secondary | ICD-10-CM

## 2017-11-27 MED ORDER — PREDNISONE 20 MG PO TABS: 40.0000 mg | ORAL_TABLET | Freq: Every day | ORAL | 0 refills | Status: DC

## 2017-11-27 NOTE — Patient Instructions (Signed)
See you primary care doctor to address your blood pressure as soon as possible  Low-Sodium Eating Plan Sodium, which is an element that makes up salt, helps you maintain a healthy balance of fluids in your body. Too much sodium can increase your blood pressure and cause fluid and waste to be held in your body. Your health care provider or dietitian may recommend following this plan if you have high blood pressure (hypertension), kidney disease, liver disease, or heart failure. Eating less sodium can help lower your blood pressure, reduce swelling, and protect your heart, liver, and kidneys. What are tips for following this plan? General guidelines  Most people on this plan should limit their sodium intake to 1,500-2,000 mg (milligrams) of sodium each day. Reading food labels  The Nutrition Facts label lists the amount of sodium in one serving of the food. If you eat more than one serving, you must multiply the listed amount of sodium by the number of servings.  Choose foods with less than 140 mg of sodium per serving.  Avoid foods with 300 mg of sodium or more per serving. Shopping  Look for lower-sodium products, often labeled as "low-sodium" or "no salt added."  Always check the sodium content even if foods are labeled as "unsalted" or "no salt added".  Buy fresh foods. ? Avoid canned foods and premade or frozen meals. ? Avoid canned, cured, or processed meats  Buy breads that have less than 80 mg of sodium per slice. Cooking  Eat more home-cooked food and less restaurant, buffet, and fast food.  Avoid adding salt when cooking. Use salt-free seasonings or herbs instead of table salt or sea salt. Check with your health care provider or pharmacist before using salt substitutes.  Cook with plant-based oils, such as canola, sunflower, or olive oil. Meal planning  When eating at a restaurant, ask that your food be prepared with less salt or no salt, if possible.  Avoid foods that  contain MSG (monosodium glutamate). MSG is sometimes added to [place] food, bouillon, and some canned foods. What foods are recommended? The items listed may not be a complete list. Talk with your dietitian about what dietary choices are best for you. Grains Low-sodium cereals, including oats, puffed wheat and rice, and shredded wheat. Low-sodium crackers. Unsalted rice. Unsalted pasta. Low-sodium bread. Whole-grain breads and whole-grain pasta. Vegetables Fresh or frozen vegetables. "No salt added" canned vegetables. "No salt added" tomato sauce and paste. Low-sodium or reduced-sodium tomato and vegetable juice. Fruits Fresh, frozen, or canned fruit. Fruit juice. Meats and other protein foods Fresh or frozen (no salt added) meat, poultry, seafood, and fish. Low-sodium canned tuna and salmon. Unsalted nuts. Dried peas, beans, and lentils without added salt. Unsalted canned beans. Eggs. Unsalted nut butters. Dairy Milk. Soy milk. Cheese that is naturally low in sodium, such as ricotta cheese, fresh mozzarella, or Swiss cheese Low-sodium or reduced-sodium cheese. Cream cheese. Yogurt. Fats and oils Unsalted butter. Unsalted margarine with no trans fat. Vegetable oils such as canola or olive oils. Seasonings and other foods Fresh and dried herbs and spices. Salt-free seasonings. Low-sodium mustard and ketchup. Sodium-free salad dressing. Sodium-free light mayonnaise. Fresh or refrigerated horseradish. Lemon juice. Vinegar. Homemade, reduced-sodium, or low-sodium soups. Unsalted popcorn and pretzels. Low-salt or salt-free chips. What foods are not recommended? The items listed may not be a complete list. Talk with your dietitian about what dietary choices are best for you. Grains Instant hot cereals. Bread stuffing, pancake, and biscuit mixes. Croutons. Seasoned rice  or pasta mixes. Noodle soup cups. Boxed or frozen macaroni and cheese. Regular salted crackers. Self-rising  flour. Vegetables Sauerkraut, pickled vegetables, and relishes. Olives. [place] fries. Onion rings. Regular canned vegetables (not low-sodium or reduced-sodium). Regular canned tomato sauce and paste (not low-sodium or reduced-sodium). Regular tomato and vegetable juice (not low-sodium or reduced-sodium). Frozen vegetables in sauces. Meats and other protein foods Meat or fish that is salted, canned, smoked, spiced, or pickled. Bacon, ham, sausage, hotdogs, corned beef, chipped beef, packaged lunch meats, salt pork, jerky, pickled herring, anchovies, regular canned tuna, sardines, salted nuts. Dairy Processed cheese and cheese spreads. Cheese curds. Blue cheese. Feta cheese. String cheese. Regular cottage cheese. Buttermilk. Canned milk. Fats and oils Salted butter. Regular margarine. Ghee. Bacon fat. Seasonings and other foods Onion salt, garlic salt, seasoned salt, table salt, and sea salt. Canned and packaged gravies. Worcestershire sauce. Tartar sauce. Barbecue sauce. Teriyaki sauce. Soy sauce, including reduced-sodium. Steak sauce. Fish sauce. Oyster sauce. Cocktail sauce. Horseradish that you find on the shelf. Regular ketchup and mustard. Meat flavorings and tenderizers. Bouillon cubes. Hot sauce and Tabasco sauce. Premade or packaged marinades. Premade or packaged taco seasonings. Relishes. Regular salad dressings. Salsa. Potato and tortilla chips. Corn chips and puffs. Salted popcorn and pretzels. Canned or dried soups. Pizza. Frozen entrees and pot pies. Summary  Eating less sodium can help lower your blood pressure, reduce swelling, and protect your heart, liver, and kidneys.  Most people on this plan should limit their sodium intake to 1,500-2,000 mg (milligrams) of sodium each day.  Canned, boxed, and frozen foods are high in sodium. Restaurant foods, fast foods, and pizza are also very high in sodium. You also get sodium by adding salt to food.  Try to cook at home, eat more fresh  fruits and vegetables, and eat less fast food, canned, processed, or prepared foods. This information is not intended to replace advice given to you by your health care provider. Make sure you discuss any questions you have with your health care provider. Document Released: 03/14/2002 Document Revised: 09/15/2016 Document Reviewed: 09/15/2016 Elsevier Interactive Patient Education  2018 [employer] Dermatitis Dermatitis is redness, soreness, and swelling (inflammation) of the skin. Contact dermatitis is a reaction to certain substances that touch the skin. You either touched something that irritated your skin, or you have allergies to something you touched. Follow these instructions at home: [place] your skin as needed.  Apply cool compresses to the affected areas.  Try taking a bath with: ? Epsom salts. Follow the instructions on the package. You can get these at a pharmacy or grocery store. ? Baking soda. Pour a small amount into the bath as told by your doctor. ? Colloidal oatmeal. Follow the instructions on the package. You can get this at a pharmacy or grocery store.  Try applying baking soda paste to your skin. Stir water into baking soda until it looks like paste.  Do not scratch your skin.  Bathe less often.  Bathe in lukewarm water. Avoid using hot water. Medicines  Take or apply over-the-counter and prescription medicines only as told by your doctor.  If you were prescribed an antibiotic medicine, take or apply your antibiotic as told by your doctor. Do not stop taking the antibiotic even if your condition starts to get better. General instructions  Keep all follow-up visits as told by your doctor. This is important.  Avoid the substance that caused your reaction. If you do not know what caused it,  keep a journal to try to track what caused it. Write down: ? What you eat. ? What cosmetic products you use. ? What you drink. ? What you wear in  the affected area. This includes jewelry.  If you were given a bandage (dressing), take care of it as told by your doctor. This includes when to change and remove it. Contact a doctor if:  You do not get better with treatment.  Your condition gets worse.  You have signs of infection such as: ? Swelling. ? Tenderness. ? Redness. ? Soreness. ? Warmth.  You have a fever.  You have new symptoms. Get help right away if:  You have a very bad headache.  You have neck pain.  Your neck is stiff.  You throw up (vomit).  You feel very sleepy.  You see red streaks coming from the affected area.  Your bone or joint underneath the affected area becomes painful after the skin has healed.  The affected area turns darker.  You have trouble breathing. This information is not intended to replace advice given to you by your health care provider. Make sure you discuss any questions you have with your health care provider. Document Released: 07/20/2009 Document Revised: 02/28/2016 Document Reviewed: 02/07/2015 Elsevier Interactive Patient Education  2018 [employer].

## 2017-11-27 NOTE — Progress Notes (Signed)
Subjective:     Patient ID: Jasmine Wallace, female   DOB: 08-04-1971, 47 y.o.   MRN: [id number]  HPI year old female is in today with c/o itchy rash to her trunk, arms and legs after helping her friend move 3 days ago. She reports coming in contact with cats, dogs, chickens, and birds. No SOB or chest pain.   Blood pressure is elevated today. She reports large caffeine consumption. Does not exercise. Has a PCP. Not currently on medications. Reports her blood pressure not typically this high.    Review of Systems  Constitutional: Negative.   HENT: Negative.   Respiratory: Negative.   Cardiovascular: Negative.   Endocrine: Negative.   Musculoskeletal: Negative.   Skin: Positive for rash.  Allergic/Immunologic: Positive for environmental allergies.  Psychiatric/Behavioral: Negative.        Objective:   Physical Exam  Constitutional: She is oriented to person, place, and time. She appears well-developed and well-nourished.  Neck: Normal range of motion. Neck supple.  Cardiovascular: Normal rate, regular rhythm and normal heart sounds.  Pulmonary/Chest: Effort normal and breath sounds normal.  Neurological: She is alert and oriented to person, place, and time.  Skin: Skin is warm and dry. Rash noted. There is erythema.  Psychiatric: She has a normal mood and affect.       Assessment:     Jasmine Wallace was seen today for other-rash x3d.  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  Elevated blood pressure reading  Morbid obesity ([place])      Plan:     See PCP ASAP for recheck of blood pressure.

## 2017-12-02 ENCOUNTER — Telehealth: Payer: Self-pay | Admitting: Emergency Medicine

## 2017-12-02 NOTE — Telephone Encounter (Signed)
Left message follow up call from visit with provider on 11/27/2017

## 2019-03-10 DIAGNOSIS — Z0001 Encounter for general adult medical examination with abnormal findings: Secondary | ICD-10-CM | POA: Diagnosis not present

## 2019-03-10 DIAGNOSIS — Z6841 Body Mass Index (BMI) 40.0 and over, adult: Secondary | ICD-10-CM | POA: Diagnosis not present

## 2019-03-10 DIAGNOSIS — R87610 Atypical squamous cells of undetermined significance on cytologic smear of cervix (ASC-US): Secondary | ICD-10-CM | POA: Diagnosis not present

## 2019-03-10 DIAGNOSIS — Z124 Encounter for screening for malignant neoplasm of cervix: Secondary | ICD-10-CM | POA: Diagnosis not present

## 2019-03-10 DIAGNOSIS — R600 Localized edema: Secondary | ICD-10-CM | POA: Diagnosis not present

## 2019-03-10 DIAGNOSIS — Z1389 Encounter for screening for other disorder: Secondary | ICD-10-CM | POA: Diagnosis not present

## 2019-04-05 DIAGNOSIS — R350 Frequency of micturition: Secondary | ICD-10-CM | POA: Diagnosis not present

## 2019-04-05 DIAGNOSIS — R102 Pelvic and perineal pain: Secondary | ICD-10-CM | POA: Diagnosis not present

## 2019-04-05 DIAGNOSIS — I10 Essential (primary) hypertension: Secondary | ICD-10-CM | POA: Diagnosis not present

## 2019-04-05 DIAGNOSIS — Z1389 Encounter for screening for other disorder: Secondary | ICD-10-CM | POA: Diagnosis not present

## 2019-04-05 DIAGNOSIS — Z6841 Body Mass Index (BMI) 40.0 and over, adult: Secondary | ICD-10-CM | POA: Diagnosis not present

## 2019-04-29 DIAGNOSIS — Z1389 Encounter for screening for other disorder: Secondary | ICD-10-CM | POA: Diagnosis not present

## 2019-04-29 DIAGNOSIS — I10 Essential (primary) hypertension: Secondary | ICD-10-CM | POA: Diagnosis not present

## 2019-04-29 DIAGNOSIS — Z6841 Body Mass Index (BMI) 40.0 and over, adult: Secondary | ICD-10-CM | POA: Diagnosis not present

## 2019-05-16 ENCOUNTER — Other Ambulatory Visit (HOSPITAL_COMMUNITY): Payer: Self-pay | Admitting: Physician Assistant

## 2019-05-16 DIAGNOSIS — Z1231 Encounter for screening mammogram for malignant neoplasm of breast: Secondary | ICD-10-CM

## 2019-05-25 ENCOUNTER — Ambulatory Visit (HOSPITAL_COMMUNITY)
Admission: RE | Admit: 2019-05-25 | Discharge: 2019-05-25 | Disposition: A | Discharge status: Home / Self Care | Payer: 59 | Source: Ambulatory Visit | Attending: Physician Assistant | Admitting: Physician Assistant

## 2019-05-25 ENCOUNTER — Other Ambulatory Visit: Payer: Self-pay

## 2019-05-25 DIAGNOSIS — Z1231 Encounter for screening mammogram for malignant neoplasm of breast: Secondary | ICD-10-CM

## 2020-01-27 DIAGNOSIS — Z6841 Body Mass Index (BMI) 40.0 and over, adult: Secondary | ICD-10-CM | POA: Diagnosis not present

## 2020-01-27 DIAGNOSIS — R252 Cramp and spasm: Secondary | ICD-10-CM | POA: Diagnosis not present

## 2020-01-27 DIAGNOSIS — I10 Essential (primary) hypertension: Secondary | ICD-10-CM | POA: Diagnosis not present

## 2020-01-27 DIAGNOSIS — Z1389 Encounter for screening for other disorder: Secondary | ICD-10-CM | POA: Diagnosis not present

## 2020-02-03 DIAGNOSIS — J069 Acute upper respiratory infection, unspecified: Secondary | ICD-10-CM | POA: Diagnosis not present

## 2020-03-29 DIAGNOSIS — Z6841 Body Mass Index (BMI) 40.0 and over, adult: Secondary | ICD-10-CM | POA: Diagnosis not present

## 2020-03-29 DIAGNOSIS — I10 Essential (primary) hypertension: Secondary | ICD-10-CM | POA: Diagnosis not present

## 2020-03-29 DIAGNOSIS — Z0001 Encounter for general adult medical examination with abnormal findings: Secondary | ICD-10-CM | POA: Diagnosis not present

## 2020-03-29 DIAGNOSIS — R0989 Other specified symptoms and signs involving the circulatory and respiratory systems: Secondary | ICD-10-CM | POA: Diagnosis not present

## 2020-04-27 DIAGNOSIS — I6523 Occlusion and stenosis of bilateral carotid arteries: Secondary | ICD-10-CM | POA: Diagnosis not present

## 2020-04-27 DIAGNOSIS — R0989 Other specified symptoms and signs involving the circulatory and respiratory systems: Secondary | ICD-10-CM | POA: Diagnosis not present

## 2020-04-27 DIAGNOSIS — I10 Essential (primary) hypertension: Secondary | ICD-10-CM | POA: Diagnosis not present

## 2020-08-03 DIAGNOSIS — R7309 Other abnormal glucose: Secondary | ICD-10-CM | POA: Diagnosis not present

## 2020-08-03 DIAGNOSIS — Z6841 Body Mass Index (BMI) 40.0 and over, adult: Secondary | ICD-10-CM | POA: Diagnosis not present

## 2020-08-03 DIAGNOSIS — I708 Atherosclerosis of other arteries: Secondary | ICD-10-CM | POA: Diagnosis not present

## 2020-08-03 DIAGNOSIS — E7849 Other hyperlipidemia: Secondary | ICD-10-CM | POA: Diagnosis not present

## 2020-08-03 DIAGNOSIS — I10 Essential (primary) hypertension: Secondary | ICD-10-CM | POA: Diagnosis not present

## 2020-09-07 DIAGNOSIS — J209 Acute bronchitis, unspecified: Secondary | ICD-10-CM | POA: Diagnosis not present

## 2020-09-07 DIAGNOSIS — J705 Respiratory conditions due to smoke inhalation: Secondary | ICD-10-CM | POA: Diagnosis not present

## 2020-12-14 DIAGNOSIS — M5126 Other intervertebral disc displacement, lumbar region: Secondary | ICD-10-CM | POA: Diagnosis not present

## 2020-12-14 DIAGNOSIS — Z1389 Encounter for screening for other disorder: Secondary | ICD-10-CM | POA: Diagnosis not present

## 2020-12-14 DIAGNOSIS — Z681 Body mass index (BMI) 19 or less, adult: Secondary | ICD-10-CM | POA: Diagnosis not present

## 2020-12-24 ENCOUNTER — Other Ambulatory Visit (HOSPITAL_COMMUNITY): Payer: Self-pay | Admitting: Internal Medicine

## 2020-12-24 DIAGNOSIS — Z1231 Encounter for screening mammogram for malignant neoplasm of breast: Secondary | ICD-10-CM

## 2021-01-07 ENCOUNTER — Other Ambulatory Visit (HOSPITAL_COMMUNITY): Payer: Self-pay | Admitting: Internal Medicine

## 2021-01-07 ENCOUNTER — Encounter (HOSPITAL_COMMUNITY): Payer: Self-pay

## 2021-01-07 ENCOUNTER — Other Ambulatory Visit: Payer: Self-pay

## 2021-01-07 ENCOUNTER — Ambulatory Visit (HOSPITAL_COMMUNITY)
Admission: RE | Admit: 2021-01-07 | Discharge: 2021-01-07 | Disposition: A | Discharge status: Home / Self Care | Payer: 59 | Source: Ambulatory Visit | Attending: Internal Medicine | Admitting: Internal Medicine

## 2021-01-07 DIAGNOSIS — Z1231 Encounter for screening mammogram for malignant neoplasm of breast: Secondary | ICD-10-CM | POA: Insufficient documentation

## 2021-01-07 DIAGNOSIS — N63 Unspecified lump in unspecified breast: Secondary | ICD-10-CM

## 2021-01-08 ENCOUNTER — Ambulatory Visit (HOSPITAL_COMMUNITY)
Admission: RE | Admit: 2021-01-08 | Discharge: 2021-01-08 | Disposition: A | Discharge status: Home / Self Care | Payer: 59 | Source: Ambulatory Visit | Attending: Internal Medicine | Admitting: Internal Medicine

## 2021-01-08 DIAGNOSIS — N63 Unspecified lump in unspecified breast: Secondary | ICD-10-CM

## 2021-01-08 DIAGNOSIS — R928 Other abnormal and inconclusive findings on diagnostic imaging of breast: Secondary | ICD-10-CM | POA: Diagnosis not present

## 2021-01-08 DIAGNOSIS — N6311 Unspecified lump in the right breast, upper outer quadrant: Secondary | ICD-10-CM | POA: Diagnosis not present

## 2021-01-08 DIAGNOSIS — N6313 Unspecified lump in the right breast, lower outer quadrant: Secondary | ICD-10-CM | POA: Diagnosis not present

## 2021-01-10 DIAGNOSIS — M5126 Other intervertebral disc displacement, lumbar region: Secondary | ICD-10-CM | POA: Diagnosis not present

## 2021-01-10 DIAGNOSIS — Z6841 Body Mass Index (BMI) 40.0 and over, adult: Secondary | ICD-10-CM | POA: Diagnosis not present

## 2021-01-10 DIAGNOSIS — M5416 Radiculopathy, lumbar region: Secondary | ICD-10-CM | POA: Diagnosis not present

## 2021-01-15 DIAGNOSIS — M5416 Radiculopathy, lumbar region: Secondary | ICD-10-CM | POA: Diagnosis not present

## 2021-01-15 DIAGNOSIS — M5126 Other intervertebral disc displacement, lumbar region: Secondary | ICD-10-CM | POA: Diagnosis not present

## 2021-03-12 DIAGNOSIS — Z1331 Encounter for screening for depression: Secondary | ICD-10-CM | POA: Diagnosis not present

## 2021-03-12 DIAGNOSIS — Z1389 Encounter for screening for other disorder: Secondary | ICD-10-CM | POA: Diagnosis not present

## 2021-03-12 DIAGNOSIS — Z6841 Body Mass Index (BMI) 40.0 and over, adult: Secondary | ICD-10-CM | POA: Diagnosis not present

## 2021-03-12 DIAGNOSIS — I10 Essential (primary) hypertension: Secondary | ICD-10-CM | POA: Diagnosis not present

## 2021-03-12 DIAGNOSIS — E7849 Other hyperlipidemia: Secondary | ICD-10-CM | POA: Diagnosis not present

## 2021-03-12 DIAGNOSIS — Z0001 Encounter for general adult medical examination with abnormal findings: Secondary | ICD-10-CM | POA: Diagnosis not present

## 2021-05-20 DIAGNOSIS — J069 Acute upper respiratory infection, unspecified: Secondary | ICD-10-CM | POA: Diagnosis not present

## 2021-05-24 ENCOUNTER — Other Ambulatory Visit: Payer: Self-pay

## 2021-05-24 MED ORDER — CARESTART COVID-19 HOME TEST VI KIT
PACK | 0 refills | Status: DC
  Filled 2021-05-24: qty 2, 4d supply, fill #0

## 2021-06-04 ENCOUNTER — Other Ambulatory Visit: Payer: Self-pay

## 2021-06-18 DIAGNOSIS — Z6841 Body Mass Index (BMI) 40.0 and over, adult: Secondary | ICD-10-CM | POA: Diagnosis not present

## 2021-07-21 ENCOUNTER — Other Ambulatory Visit: Payer: Self-pay

## 2021-07-21 ENCOUNTER — Ambulatory Visit
Admission: EM | Admit: 2021-07-21 | Discharge: 2021-07-21 | Disposition: A | Discharge status: Home / Self Care | Payer: 59 | Attending: Emergency Medicine | Admitting: Emergency Medicine

## 2021-07-21 DIAGNOSIS — N760 Acute vaginitis: Secondary | ICD-10-CM | POA: Diagnosis not present

## 2021-07-21 MED ORDER — FLUCONAZOLE 150 MG PO TABS: 150.0000 mg | ORAL_TABLET | Freq: Once | ORAL | 1 refills | Status: AC

## 2021-07-21 NOTE — ED Provider Notes (Signed)
HPI  SUBJECTIVE:  Jasmine Wallace is a 50 y.o. female who presents with 3 days of vaginal itching and burning pain, states that it got worse last night.  No vaginal odor, bleeding, discharge, rash, labial swelling, urinary complaints.  No recent antibiotics.  She states that she is not sexually active.  No perfumed soaps or body washes.  She has tried Azo yeast infection and yogurt starting yesterday without much improvement in her symptoms.   No h/o DM.  She has a history of hypertension, hypercholesterolemia.  TFT:DDUKG, Purcell Nails, MD   History reviewed. No pertinent past medical history.  Past Surgical History:  Procedure Laterality Date   ANKLE SURGERY     CHOLECYSTECTOMY     TONSILLECTOMY      History reviewed. No pertinent family history.  Social History   Tobacco Use   Smoking status: Never   Smokeless tobacco: Never  Substance Use Topics   Alcohol use: No    Alcohol/week: 0.0 standard drinks   Drug use: No    No current facility-administered medications for this encounter.  Current Outpatient Medications:    aspirin EC 81 MG tablet, Take 81 mg by mouth daily. Swallow whole., Disp: , Rfl:    fluconazole (DIFLUCAN) 150 MG tablet, Take 1 tablet (150 mg total) by mouth once for 1 dose. 1 tab po x 1. May repeat in 72 hours if no improvement, Disp: 2 tablet, Rfl: 1   acetaminophen (TYLENOL) 500 MG tablet, Take 500 mg by mouth every 6 (six) hours as needed., Disp: , Rfl:    atorvastatin (LIPITOR) 20 MG tablet, SMARTSIG:1 Tablet(s) By Mouth Every Evening, Disp: , Rfl:    COVID-19 At Home Antigen Test (CARESTART COVID-19 HOME TEST) KIT, use as directed, Disp: 2 kit, Rfl: 0   fluticasone (FLONASE) 50 MCG/ACT nasal spray, Place 2 sprays into both nostrils daily., Disp: 16 g, Rfl: 6   hydrochlorothiazide (HYDRODIURIL) 25 MG tablet, Take 25 mg by mouth daily., Disp: , Rfl:    losartan (COZAAR) 25 MG tablet, Take 25 mg by mouth 2 (two) times daily., Disp: , Rfl:    phentermine  (ADIPEX-P) 37.5 MG tablet, Take 37.5 mg by mouth every morning., Disp: , Rfl:    potassium chloride SA (KLOR-CON) 20 MEQ tablet, Take 20 mEq by mouth 2 (two) times daily., Disp: , Rfl:   No Known Allergies   ROS  As noted in HPI.   Physical Exam  BP (!) 129/97 (BP Location: Right Arm)   Pulse 84   Temp 98.6 F (37 C) (Oral)   Resp 20   LMP 07/17/2021 (Approximate)   SpO2 98%   Constitutional: Well developed, well nourished, no acute distress Eyes:  EOMI, conjunctiva normal bilaterally HENT: Normocephalic, atraumatic,mucus membranes moist Respiratory: Normal inspiratory effort Cardiovascular: Normal rate GI: nondistended soft, nontender. No suprapubic, flank tenderness  back: No CVA tenderness GU: Deferred skin: No rash, skin intact Musculoskeletal: no deformities Neurologic: Alert & oriented x 3, no focal neuro deficits Psychiatric: Speech and behavior appropriate   ED Course   Medications - No data to display  No orders of the defined types were placed in this encounter.   No results found for this or any previous visit (from the past 24 hour(s)). No results found.  ED Clinical Impression  1. Vaginitis and vulvovaginitis      ED Assessment/Plan  History most c/w yeast infection.  Checking vaginal swab for BV and yeast.  Deferring STD testing as I think she is  very low risk.  Will send home with diflucan for yeast infection.  Will call in prescription of Flagyl if BV comes back positive.  Pt provided working phone number. Follow-up with PMD as needed. Discussed labs, MDM, treatment plan with patient. Pt agrees with plan.   Meds ordered this encounter  Medications   fluconazole (DIFLUCAN) 150 MG tablet    Sig: Take 1 tablet (150 mg total) by mouth once for 1 dose. 1 tab po x 1. May repeat in 72 hours if no improvement    Dispense:  2 tablet    Refill:  1    *This clinic note was created using [employer]. Therefore, there may be occasional  mistakes despite careful proofreading.  ?     Melynda Ripple, MD 07/22/21 [phone]

## 2021-07-21 NOTE — ED Triage Notes (Signed)
Pt report Friday night she began having yeast infection symptoms, she reports having vaginal itching and irritation.   Home interventions: AZO pills taken yesterday

## 2021-07-21 NOTE — Discharge Instructions (Addendum)
I have sent off a swab to test for bacterial vaginosis in addition to yeast.  I am going to treat you as if this is a yeast infection.  You may use miconazole Monistat external cooling cream as well as for extra relief.  We will contact you if your swab comes back positive for BV and we will call in Flagyl at that time.

## 2021-07-22 LAB — CERVICOVAGINAL ANCILLARY ONLY
Bacterial Vaginitis (gardnerella): NEGATIVE
Candida Glabrata: NEGATIVE
Candida Vaginitis: NEGATIVE
Comment: NEGATIVE
Comment: NEGATIVE
Comment: NEGATIVE

## 2021-10-14 DIAGNOSIS — M5126 Other intervertebral disc displacement, lumbar region: Secondary | ICD-10-CM | POA: Diagnosis not present

## 2021-10-14 DIAGNOSIS — I10 Essential (primary) hypertension: Secondary | ICD-10-CM | POA: Diagnosis not present

## 2021-10-14 DIAGNOSIS — M5416 Radiculopathy, lumbar region: Secondary | ICD-10-CM | POA: Diagnosis not present

## 2021-10-22 DIAGNOSIS — M5416 Radiculopathy, lumbar region: Secondary | ICD-10-CM | POA: Diagnosis not present

## 2021-10-30 DIAGNOSIS — M5416 Radiculopathy, lumbar region: Secondary | ICD-10-CM | POA: Diagnosis not present

## 2021-10-30 DIAGNOSIS — I10 Essential (primary) hypertension: Secondary | ICD-10-CM | POA: Diagnosis not present

## 2021-10-30 DIAGNOSIS — M5126 Other intervertebral disc displacement, lumbar region: Secondary | ICD-10-CM | POA: Diagnosis not present

## 2021-11-13 DIAGNOSIS — M5416 Radiculopathy, lumbar region: Secondary | ICD-10-CM | POA: Diagnosis not present

## 2021-12-02 ENCOUNTER — Other Ambulatory Visit: Payer: Self-pay | Admitting: Neurosurgery

## 2021-12-02 DIAGNOSIS — M5416 Radiculopathy, lumbar region: Secondary | ICD-10-CM | POA: Diagnosis not present

## 2021-12-05 IMAGING — US US BREAST*R* LIMITED INC AXILLA
1 series · 8 of 8 positions shown · non-contrast
Comparison: Previous exams.

ACR Breast Density Category a: The breast tissue is almost entirely
fatty.

CLINICAL DATA: 49-year-old female with a palpable area of concern
in the right breast.

EXAM:
DIGITAL DIAGNOSTIC BILATERAL MAMMOGRAM WITH TOMOSYNTHESIS AND CAD;
ULTRASOUND RIGHT BREAST LIMITED
TECHNIQUE: Bilateral digital diagnostic mammography and breast tomosynthesis
was performed. The images were evaluated with computer-aided
detection.; Targeted ultrasound examination of the right breast was
performed

[Series 1: us breast*right* limited inc axilla · 0.07mm/px · 8 of 8 slices shown]
[im 1/8]
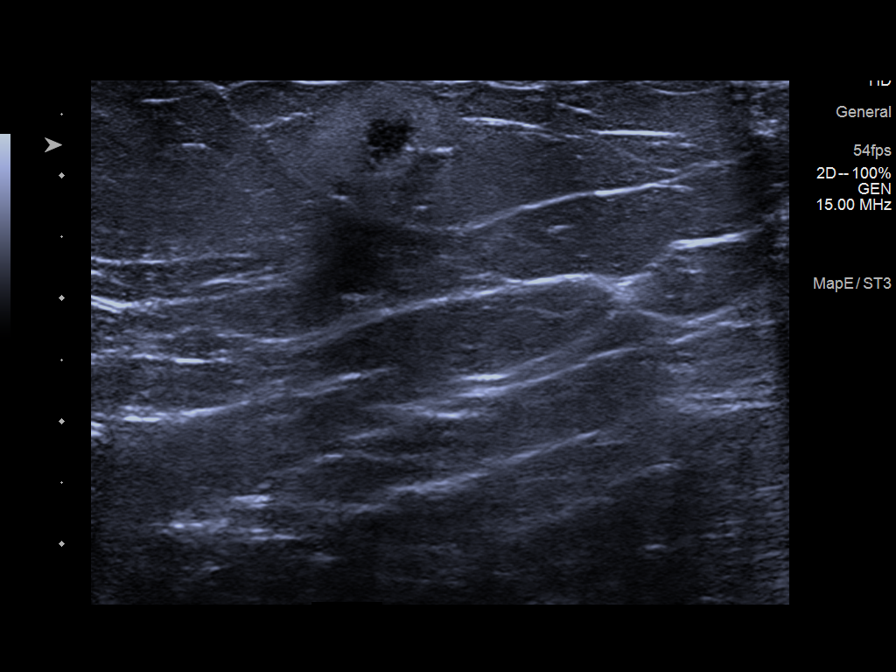
[im 2/8]
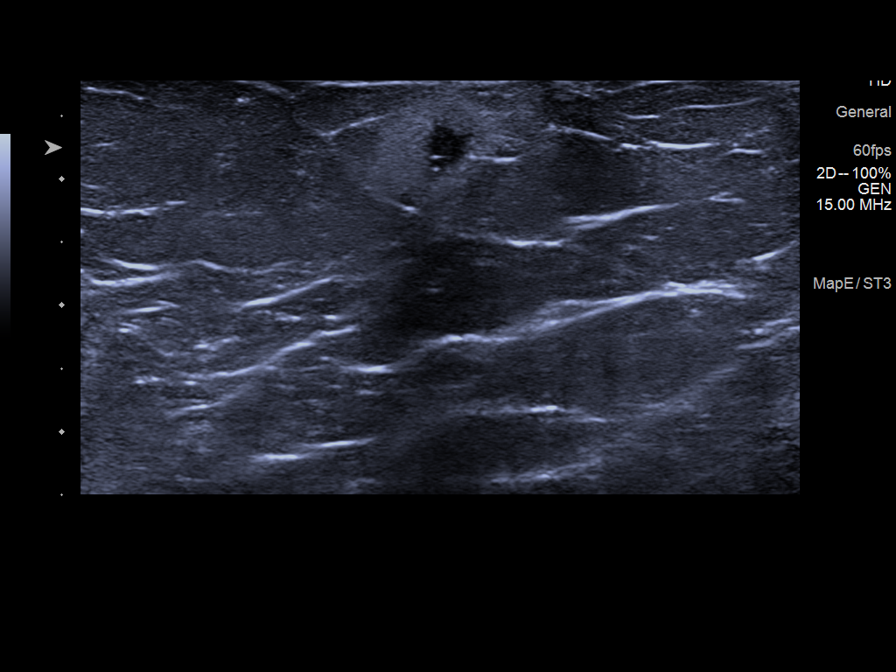
[im 3/8]
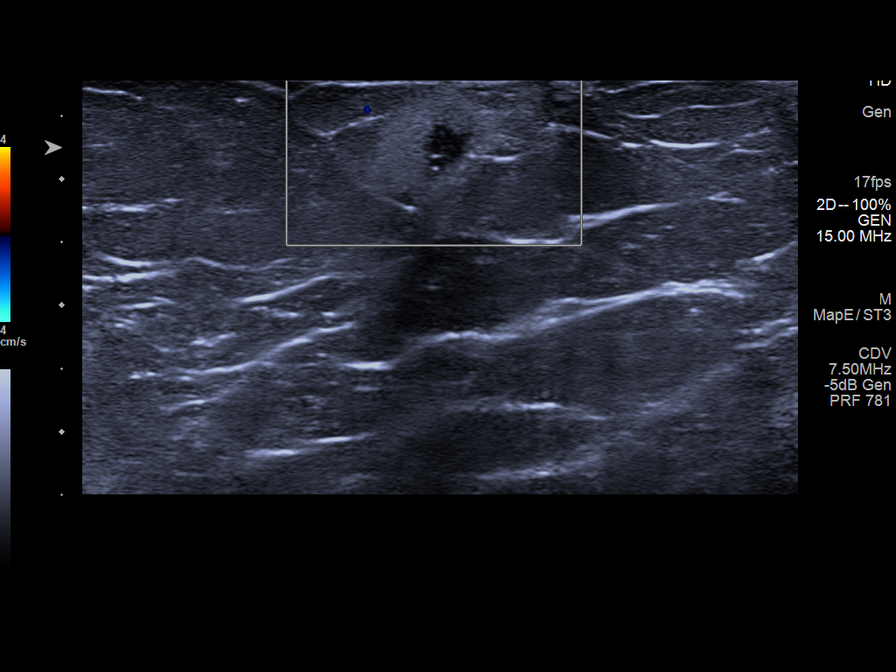
[im 4/8]
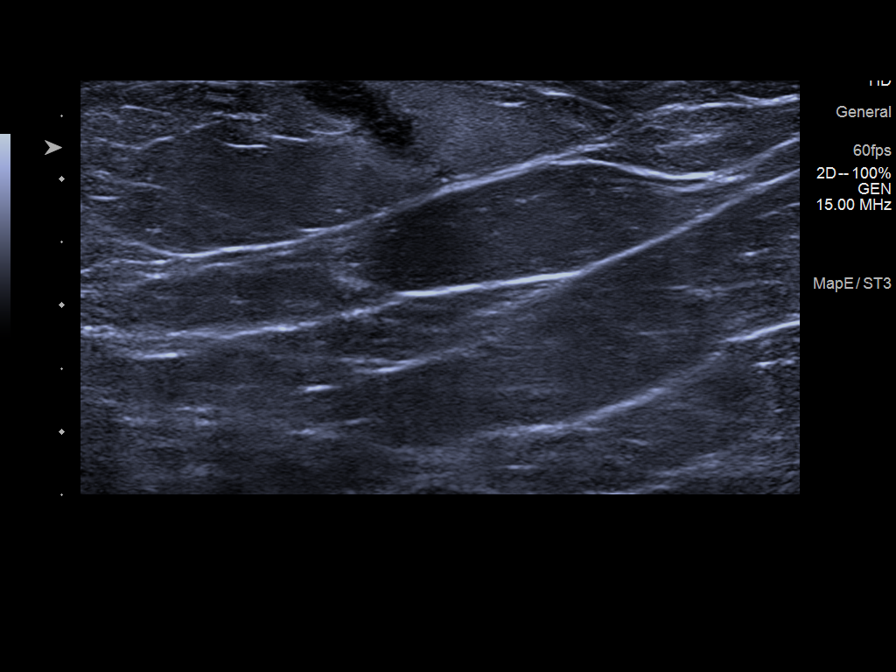
[im 5/8]
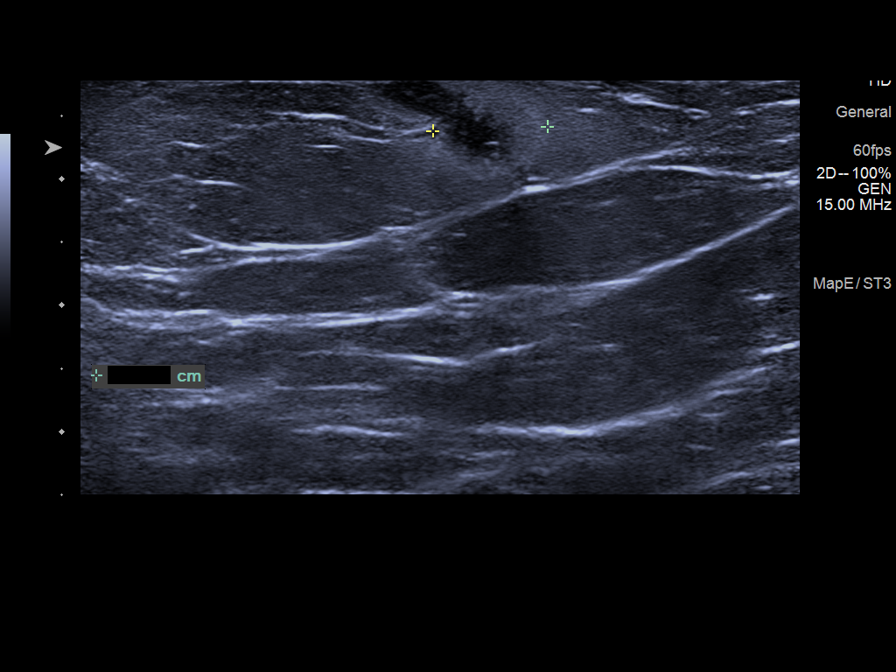
[im 6/8]
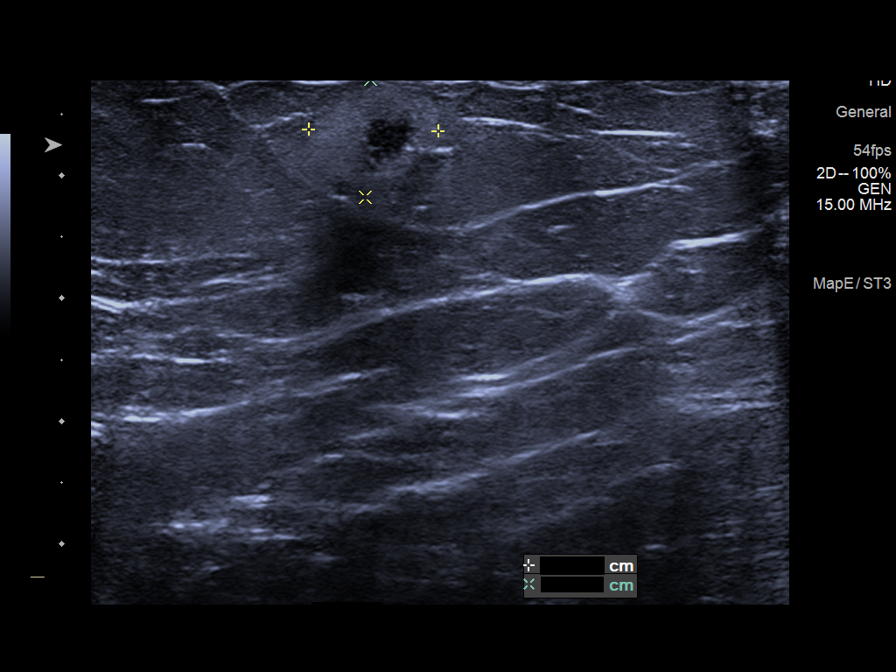
[im 7/8]
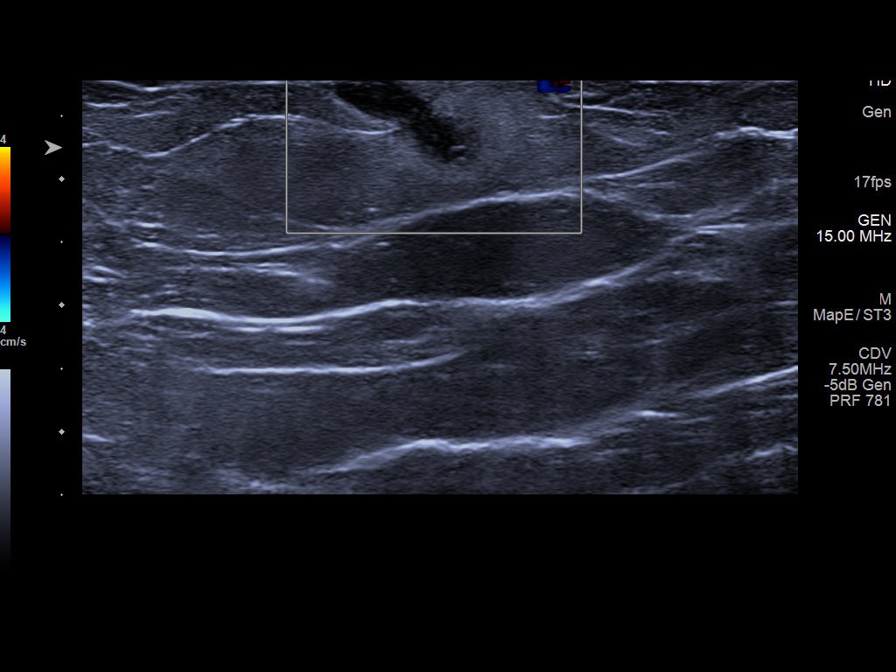
[im 8/8]
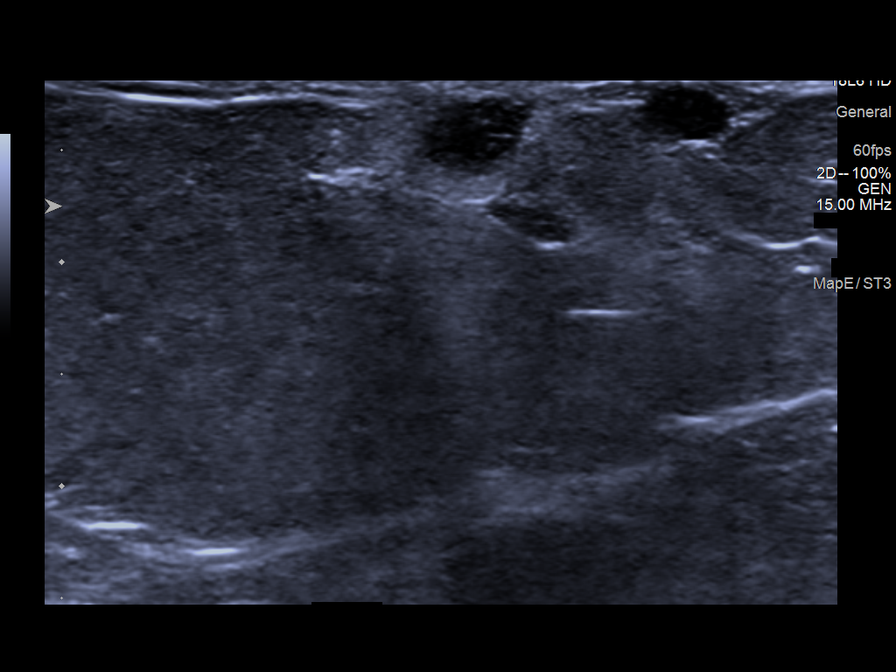

[8 of 8 positions shown; findings below may reference images not displayed]

FINDINGS: Spot compression tangential tomograms were performed over the
palpable area of concern in the upper-outer right breast. There is
an oval mixed density mass corresponding to the palpable marker in
the upper-outer right breast superficial depth measuring
approximately 1.1 cm. No suspicious masses calcifications are seen
in the left breast.

Physical examination of the outer right breast reveals a firm
pea-sized mass at the approximate 9 o'clock position. There is a
faint yellowish bruise in this location. The patient states she had
a very large bruise in this area several weeks prior.

Targeted ultrasound of the right breast was performed. There is a
mixed echogenicity mass which is hypoechoic centrally and hyper
echoic peripherally 9 o'clock 3 cm from nipple superficial depth.
This measures 1.5 x 1 x 1.1 cm and corresponds well with the mass
seen in the right breast at mammography. An additional similar
appearing cystic mass is present also at the 9 o'clock position.
Findings are most suggestive of fat necrosis.
IMPRESSION: Probably benign right breast mass at 9 o'clock, with imaging
features most suggestive of fat necrosis.

RECOMMENDATION:
Recommend the patient return in 3 months for right breast ultrasound
to ensure improvement/resolution of the probable area of fat
necrosis in the right breast.

I have discussed the findings and recommendations with the patient.
If applicable, a reminder letter will be sent to the patient
regarding the next appointment.

BI-RADS CATEGORY  3: Probably benign.

## 2021-12-17 DIAGNOSIS — M5416 Radiculopathy, lumbar region: Secondary | ICD-10-CM | POA: Diagnosis not present

## 2021-12-18 ENCOUNTER — Ambulatory Visit
Admission: RE | Admit: 2021-12-18 | Discharge: 2021-12-18 | Disposition: A | Discharge status: Home / Self Care | Payer: 59 | Source: Ambulatory Visit | Attending: Neurosurgery | Admitting: Neurosurgery

## 2021-12-18 DIAGNOSIS — M48061 Spinal stenosis, lumbar region without neurogenic claudication: Secondary | ICD-10-CM | POA: Diagnosis not present

## 2021-12-18 DIAGNOSIS — M5416 Radiculopathy, lumbar region: Secondary | ICD-10-CM

## 2021-12-18 DIAGNOSIS — M47817 Spondylosis without myelopathy or radiculopathy, lumbosacral region: Secondary | ICD-10-CM | POA: Diagnosis not present

## 2021-12-30 DIAGNOSIS — M5416 Radiculopathy, lumbar region: Secondary | ICD-10-CM | POA: Diagnosis not present

## 2022-02-03 DIAGNOSIS — M5416 Radiculopathy, lumbar region: Secondary | ICD-10-CM | POA: Diagnosis not present

## 2022-02-03 DIAGNOSIS — M5126 Other intervertebral disc displacement, lumbar region: Secondary | ICD-10-CM | POA: Diagnosis not present

## 2022-02-14 DIAGNOSIS — M5416 Radiculopathy, lumbar region: Secondary | ICD-10-CM | POA: Diagnosis not present

## 2022-03-25 ENCOUNTER — Other Ambulatory Visit (HOSPITAL_COMMUNITY): Payer: Self-pay | Admitting: Family Medicine

## 2022-03-25 DIAGNOSIS — Z1331 Encounter for screening for depression: Secondary | ICD-10-CM | POA: Diagnosis not present

## 2022-03-25 DIAGNOSIS — M5416 Radiculopathy, lumbar region: Secondary | ICD-10-CM | POA: Diagnosis not present

## 2022-03-25 DIAGNOSIS — N6310 Unspecified lump in the right breast, unspecified quadrant: Secondary | ICD-10-CM

## 2022-03-25 DIAGNOSIS — Z6841 Body Mass Index (BMI) 40.0 and over, adult: Secondary | ICD-10-CM | POA: Diagnosis not present

## 2022-03-25 DIAGNOSIS — E782 Mixed hyperlipidemia: Secondary | ICD-10-CM | POA: Diagnosis not present

## 2022-03-25 DIAGNOSIS — E7849 Other hyperlipidemia: Secondary | ICD-10-CM | POA: Diagnosis not present

## 2022-03-25 DIAGNOSIS — E669 Obesity, unspecified: Secondary | ICD-10-CM | POA: Diagnosis not present

## 2022-03-25 DIAGNOSIS — R7309 Other abnormal glucose: Secondary | ICD-10-CM | POA: Diagnosis not present

## 2022-03-25 DIAGNOSIS — I10 Essential (primary) hypertension: Secondary | ICD-10-CM | POA: Diagnosis not present

## 2022-03-25 DIAGNOSIS — M5126 Other intervertebral disc displacement, lumbar region: Secondary | ICD-10-CM | POA: Diagnosis not present

## 2022-03-25 DIAGNOSIS — Z0001 Encounter for general adult medical examination with abnormal findings: Secondary | ICD-10-CM | POA: Diagnosis not present

## 2022-04-15 ENCOUNTER — Ambulatory Visit (HOSPITAL_COMMUNITY)
Admission: RE | Admit: 2022-04-15 | Discharge: 2022-04-15 | Disposition: A | Discharge status: Home / Self Care | Payer: 59 | Source: Ambulatory Visit | Attending: Family Medicine | Admitting: Family Medicine

## 2022-04-15 ENCOUNTER — Encounter (HOSPITAL_COMMUNITY): Payer: Self-pay

## 2022-04-15 ENCOUNTER — Other Ambulatory Visit (HOSPITAL_COMMUNITY): Payer: 59

## 2022-04-15 ENCOUNTER — Encounter (HOSPITAL_COMMUNITY): Payer: 59

## 2022-04-15 DIAGNOSIS — R928 Other abnormal and inconclusive findings on diagnostic imaging of breast: Secondary | ICD-10-CM | POA: Diagnosis not present

## 2022-04-15 DIAGNOSIS — N6310 Unspecified lump in the right breast, unspecified quadrant: Secondary | ICD-10-CM | POA: Insufficient documentation

## 2022-05-06 DIAGNOSIS — Z6841 Body Mass Index (BMI) 40.0 and over, adult: Secondary | ICD-10-CM | POA: Diagnosis not present

## 2022-05-06 DIAGNOSIS — E039 Hypothyroidism, unspecified: Secondary | ICD-10-CM | POA: Diagnosis not present

## 2022-06-23 DIAGNOSIS — J069 Acute upper respiratory infection, unspecified: Secondary | ICD-10-CM | POA: Diagnosis not present

## 2022-12-30 DIAGNOSIS — J069 Acute upper respiratory infection, unspecified: Secondary | ICD-10-CM | POA: Diagnosis not present

## 2022-12-30 DIAGNOSIS — E039 Hypothyroidism, unspecified: Secondary | ICD-10-CM | POA: Diagnosis not present

## 2023-03-30 DIAGNOSIS — I10 Essential (primary) hypertension: Secondary | ICD-10-CM | POA: Diagnosis not present

## 2023-03-30 DIAGNOSIS — E039 Hypothyroidism, unspecified: Secondary | ICD-10-CM | POA: Diagnosis not present

## 2023-04-02 ENCOUNTER — Encounter: Payer: Self-pay | Admitting: *Deleted

## 2023-04-20 ENCOUNTER — Other Ambulatory Visit (HOSPITAL_COMMUNITY): Payer: Self-pay | Admitting: Internal Medicine

## 2023-04-20 DIAGNOSIS — Z1231 Encounter for screening mammogram for malignant neoplasm of breast: Secondary | ICD-10-CM

## 2023-04-27 ENCOUNTER — Ambulatory Visit (HOSPITAL_COMMUNITY)
Admission: RE | Admit: 2023-04-27 | Discharge: 2023-04-27 | Disposition: A | Discharge status: Home / Self Care | Payer: 59 | Source: Ambulatory Visit | Attending: Internal Medicine | Admitting: Internal Medicine

## 2023-04-27 ENCOUNTER — Encounter (HOSPITAL_COMMUNITY): Payer: Self-pay

## 2023-04-27 DIAGNOSIS — Z1231 Encounter for screening mammogram for malignant neoplasm of breast: Secondary | ICD-10-CM | POA: Diagnosis not present

## 2023-05-27 DIAGNOSIS — U071 COVID-19: Secondary | ICD-10-CM | POA: Diagnosis not present

## 2023-08-04 ENCOUNTER — Telehealth: Payer: Self-pay | Admitting: Internal Medicine

## 2023-08-04 NOTE — Addendum Note (Signed)
Addended by: Armstead Peaks on: 08/04/2023 04:11 PM   Modules accepted: Orders

## 2023-08-04 NOTE — Telephone Encounter (Signed)
Questionnaire in review

## 2023-08-04 NOTE — Telephone Encounter (Addendum)
  Procedure: Colonoscopy  Height: 5'3 Weight: 310lbs        Have you had a colonoscopy before?  no  Do you have family history of colon cancer?  no  Do you have a family history of polyps? yes  Previous colonoscopy with polyps removed? N/a  Do you have a history colorectal cancer?   no  Are you diabetic?  no  Do you have a prosthetic or mechanical heart valve? no  Do you have a pacemaker/defibrillator?   no  Have you had endocarditis/atrial fibrillation?  no  Do you use supplemental oxygen/CPAP?  no  Have you had joint replacement within the last 12 months?  no  Do you tend to be constipated or have to use laxatives?  no   Do you have history of alcohol use? If yes, how much and how often.  no  Do you have history or are you using drugs? If yes, what do are you  using?  no  Have you ever had a stroke/heart attack?  no  Have you ever had a heart or other vascular stent placed,?no  Do you take weight loss medication? yes  female patients,: have you had a hysterectomy? no                              are you post menopausal?  no                              do you still have your menstrual cycle? no    Date of last menstrual period? 01/2023  Do you take any blood-thinning medications such as: (Plavix, aspirin, Coumadin, Aggrenox, Brilinta, Xarelto, Eliquis, Pradaxa, Savaysa or Effient)? Aspirin 81mg   If yes we need the name, milligram, dosage and who is prescribing doctor:               Current Outpatient Medications on File Prior to Visit  Medication Sig Dispense Refill   aspirin EC 81 MG tablet Take 81 mg by mouth daily. Swallow whole.     atorvastatin (LIPITOR) 20 MG tablet SMARTSIG:1 Tablet(s) By Mouth Every Evening     Calcium Carbonate (CALCIUM 600 PO) Take by mouth. Once a day     Cholecalciferol (D3 5000) 125 MCG (5000 UT) capsule Take 5,000 Units by mouth daily.     hydrochlorothiazide (HYDRODIURIL) 25 MG tablet Take 25 mg by mouth daily.     levothyroxine  (SYNTHROID) 50 MCG tablet Take 50 mcg by mouth daily before breakfast.     losartan (COZAAR) 25 MG tablet Take 25 mg by mouth 2 (two) times daily.     magnesium oxide (MAG-OX) 400 (240 Mg) MG tablet Take 400 mg by mouth daily.     potassium chloride SA (KLOR-CON) 20 MEQ tablet Take 20 mEq by mouth 2 (two) times daily.     SEMAGLUTIDE,0.25 OR 0.5MG /DOS, [place] 10 units weekly     zinc gluconate 50 MG tablet Take 50 mg by mouth daily.     No current facility-administered medications on file prior to visit.      No Known Allergies

## 2023-08-05 NOTE — Telephone Encounter (Signed)
ASA 3. OV due to BMI 54.9

## 2023-08-07 NOTE — Telephone Encounter (Signed)
Left patient a message to scheduel OV before prcoedure

## 2023-08-18 ENCOUNTER — Encounter: Payer: Self-pay | Admitting: Gastroenterology

## 2023-08-18 ENCOUNTER — Ambulatory Visit (INDEPENDENT_AMBULATORY_CARE_PROVIDER_SITE_OTHER): Payer: 59 | Admitting: Gastroenterology

## 2023-08-18 VITALS — BP 138/79 | HR 67 | Temp 98.5°F | Ht 63.0 in | Wt 306.0 lb

## 2023-08-18 DIAGNOSIS — Z01818 Encounter for other preprocedural examination: Secondary | ICD-10-CM | POA: Diagnosis not present

## 2023-08-18 DIAGNOSIS — Z83719 Family history of colon polyps, unspecified: Secondary | ICD-10-CM | POA: Diagnosis not present

## 2023-08-18 DIAGNOSIS — Z1211 Encounter for screening for malignant neoplasm of colon: Secondary | ICD-10-CM | POA: Insufficient documentation

## 2023-08-18 NOTE — Progress Notes (Signed)
GI Office Note    Referring Provider: Elfredia Nevins, MD Primary Care Physician:  Elfredia Nevins, MD  Primary Gastroenterologist: Roetta Sessions, MD   Chief Complaint   Chief Complaint  Patient presents with   Colonoscopy     History of Present Illness   Jasmine Wallace is a 52 y.o. female presenting today to schedule colonoscopy. She has never had a colonoscopy. Sister had colon polyps at age 45.   BMs are regular. No melena, brbpr. No abdominal pain. No UGI symptoms.   She has lost over 50 pounds since 03/2023 on semaglutide.  June 2024: Blood cell count 6800, hemoglobin 12.5, lakelets 308,000, creatinine 0.75, total bilirubin 0.4, alkaline phosphatase 144, AST 24, ALT 20  Medications   Current Outpatient Medications  Medication Sig Dispense Refill   aspirin EC 81 MG tablet Take 81 mg by mouth daily. Swallow whole.     atorvastatin (LIPITOR) 20 MG tablet SMARTSIG:1 Tablet(s) By Mouth Every Evening     Calcium Carbonate (CALCIUM 600 PO) Take by mouth. Once a day     Cholecalciferol (D3 5000) 125 MCG (5000 UT) capsule Take 5,000 Units by mouth daily.     hydrochlorothiazide (HYDRODIURIL) 25 MG tablet Take 25 mg by mouth daily.     levothyroxine (SYNTHROID) 50 MCG tablet Take 50 mcg by mouth daily before breakfast.     losartan (COZAAR) 25 MG tablet Take 25 mg by mouth 2 (two) times daily.     magnesium oxide (MAG-OX) 400 (240 Mg) MG tablet Take 400 mg by mouth daily.     ondansetron (ZOFRAN-ODT) 4 MG disintegrating tablet Take 4 mg by mouth every 8 (eight) hours as needed.     potassium chloride SA (KLOR-CON) 20 MEQ tablet Take 20 mEq by mouth 2 (two) times daily.     SEMAGLUTIDE,0.25 OR 0.5MG /DOS, [place] 10 units weekly     zinc gluconate 50 MG tablet Take 50 mg by mouth daily.     No current facility-administered medications for this visit.    Allergies   Allergies as of 08/18/2023   (No Known Allergies)    Past Medical History   Past Medical History:   Diagnosis Date   HTN (hypertension)    Hyperlipidemia    Hypothyroidism    Obesity     Past Surgical History   Past Surgical History:  Procedure Laterality Date   ANKLE SURGERY Right    CHOLECYSTECTOMY     TONSILLECTOMY      Past Family History   Family History  Problem Relation Age of Onset   Colon polyps Sister 16   Colon cancer Neg Hx     Past Social History   Social History   Socioeconomic History   Marital status: Single    Spouse name: Not on file   Number of children: Not on file   Years of education: Not on file   Highest education level: Not on file  Occupational History   Not on file  Tobacco Use   Smoking status: Never   Smokeless tobacco: Never  Substance and Sexual Activity   Alcohol use: No    Alcohol/week: 0.0 standard drinks of alcohol   Drug use: No   Sexual activity: Yes    Birth control/protection: None  Other Topics Concern   Not on file  Social History Narrative   Not on file   Social Determinants of Health   Financial Resource Strain: Not on file  Food Insecurity: Not on file  Transportation Needs: Not on file  Physical Activity: Not on file  Stress: Not on file  Social Connections: Not on file  Intimate Partner Violence: Not on file    Review of Systems   General: Negative for anorexia, weight loss, fever, chills, fatigue, weakness. Eyes: Negative for vision changes.  ENT: Negative for hoarseness, difficulty swallowing , nasal congestion. CV: Negative for chest pain, angina, palpitations, dyspnea on exertion, peripheral edema.  Respiratory: Negative for dyspnea at rest, dyspnea on exertion, cough, sputum, wheezing.  GI: See history of present illness. GU:  Negative for dysuria, hematuria, urinary incontinence, urinary frequency, nocturnal urination.  MS: Negative for joint pain, low back pain.  Derm: Negative for rash or itching.  Neuro: Negative for weakness, abnormal sensation, seizure, frequent headaches, memory loss,   confusion.  Psych: Negative for anxiety, depression, suicidal ideation, hallucinations.  Endo: Negative for unusual weight change.  Heme: Negative for bruising or bleeding. Allergy: Negative for rash or hives.  Physical Exam   BP 138/79 (BP Location: Right Arm, Patient Position: Sitting, Cuff Size: Large) Comment (Cuff Size): thigh cuff  Pulse 67   Temp 98.5 F (36.9 C) (Oral)   Ht 5\' 3"  (1.6 m)   Wt (!) 306 lb (138.8 kg)   LMP  (LMP Unknown)   SpO2 98%   BMI 54.21 kg/m    General: Well-nourished, well-developed in no acute distress.  Head: Normocephalic, atraumatic.   Eyes: Conjunctiva pink, no icterus. Mouth: Oropharyngeal mucosa moist and pink  Neck: Supple without thyromegaly, masses, or lymphadenopathy.  Lungs: Clear to auscultation bilaterally.  Heart: Regular rate and rhythm, no murmurs rubs or gallops.  Abdomen: Bowel sounds are normal, nontender, nondistended, no hepatosplenomegaly or masses,  no abdominal bruits or hernia, no rebound or guarding.   Rectal: not performed Extremities: No lower extremity edema. No clubbing or deformities.  Neuro: Alert and oriented x 4 , grossly normal neurologically.  Skin: Warm and dry, no rash or jaundice.   Psych: Alert and cooperative, normal mood and affect.  Labs   See hpi  Imaging Studies   No results found.  Assessment/Plan:   FH colon polyps/high risk screening colonoscopy -patient desires to wait until 10/2023 due to her high deductible plan -colonoscopy with Dr. Jena Gauss. ASA 3. I have discussed the risks, alternatives, benefits with regards to but not limited to the risk of reaction to medication, bleeding, infection, perforation and the patient is agreeable to proceed. Written consent to be obtained. -hold semaglutide for 7 days before     Leanna Battles. Melvyn Neth, MHS, PA-C [facility]

## 2023-08-18 NOTE — Patient Instructions (Signed)
Colonoscopy to be scheduled for 10/2023!  You will need to hold semaglutide for 7 full days before your procedure.

## 2023-09-09 ENCOUNTER — Telehealth: Payer: Self-pay | Admitting: *Deleted

## 2023-09-09 MED ORDER — PEG 3350-KCL-NA BICARB-NACL 420 G PO SOLR: 4000.0000 mL | Freq: Once | ORAL | 0 refills | Status: AC

## 2023-09-09 NOTE — Telephone Encounter (Signed)
Called pt. She has been scheduled for TCS with Dr. Jena Gauss, ASA 3, 1/29. Aware will send instructions to her in the mail. Confirmed address. Will call back with pre-op appt. Confirmed pharmacy [facility] and will send rx there. Will have same insurance next year. She is also aware to hold semaglutide 7 days prior.

## 2023-10-30 NOTE — Patient Instructions (Signed)
Jasmine Wallace  10/30/2023     @PREFPERIOPPHARMACY @   Your procedure is scheduled on  11/04/2023.   Report to [facility] at  0700  A.M.   Call this number if you have problems the morning of surgery:  [phone]  If you experience any cold or flu symptoms such as cough, fever, chills, shortness of breath, etc. between now and your scheduled surgery, please notify us at the above number.   Remember:        Your last dose of semaglutide should have been on 10/27/2023.   Follow the diet and prep instructions give to you by the office.   You may drink clear liquids until 0500 am on 11/04/2023.     Clear liquids allowed are:                    Water, Juice (No red color; non-citric and without pulp; diabetics please choose diet or no sugar options), Carbonated beverages (diabetics please choose diet or no sugar options), Clear Tea (No creamer, milk, or cream, including half & half and powdered creamer), Black Coffee Only (No creamer, milk or cream, including half & half and powdered creamer), and Clear Sports drink (No red color; diabetics please choose diet or no sugar options)    Take these medicines the morning of surgery with A SIP OF WATER                                     levothyroxine.    Do not wear jewelry, make-up or nail polish, including gel polish,  artificial nails, or any other type of covering on natural nails (fingers and  toes).  Do not wear lotions, powders, or perfumes, or deodorant.  Do not shave 48 hours prior to surgery.  Men may shave face and neck.  Do not bring valuables to the hospital.  [facility] is not responsible for any belongings or valuables.  Contacts, dentures or bridgework may not be worn into surgery.  Leave your suitcase in the car.  After surgery it may be brought to your room.  For patients admitted to the hospital, discharge time will be determined by your treatment team.  Patients discharged the day of surgery will not be  allowed to drive home and must have someone with them for 24 hours.    Special instructions:   DO NOT smoke tobacco or vape for 24 hours before your procedure.  Please read over the following fact sheets that you were given. Anesthesia Post-op Instructions and Care and Recovery After Surgery      Colonoscopy, Adult, Care After The following information offers guidance on how to care for yourself after your procedure. Your health care provider may also give you more specific instructions. If you have problems or questions, contact your health care provider. What can I expect after the procedure? After the procedure, it is common to have: A small amount of blood in your stool for 24 hours after the procedure. Some gas. Mild cramping or bloating of your abdomen. Follow these instructions at home: Eating and drinking  Drink enough fluid to keep your urine pale yellow. Follow instructions from your health care provider about eating or drinking restrictions. Resume your normal diet as told by your health care provider. Avoid heavy or fried foods that are hard to digest. Activity Rest as told by  your health care provider. Avoid sitting for a long time without moving. Get up to take short walks every 1-2 hours. This is important to improve blood flow and breathing. Ask for help if you feel weak or unsteady. Return to your normal activities as told by your health care provider. Ask your health care provider what activities are safe for you. Managing cramping and bloating  Try walking around when you have cramps or feel bloated. If directed, apply heat to your abdomen as told by your health care provider. Use the heat source that your health care provider recommends, such as a moist heat pack or a heating pad. Place a towel between your skin and the heat source. Leave the heat on for 20-30 minutes. Remove the heat if your skin turns bright red. This is especially important if you are unable to  feel pain, heat, or cold. You have a greater risk of getting burned. General instructions If you were given a sedative during the procedure, it can affect you for several hours. Do not drive or operate machinery until your health care provider says that it is safe. For the first 24 hours after the procedure: Do not sign important documents. Do not drink alcohol. Do your regular daily activities at a slower pace than normal. Eat soft foods that are easy to digest. Take over-the-counter and prescription medicines only as told by your health care provider. Keep all follow-up visits. This is important. Contact a health care provider if: You have blood in your stool 2-3 days after the procedure. Get help right away if: You have more than a small spotting of blood in your stool. You have large blood clots in your stool. You have swelling of your abdomen. You have nausea or vomiting. You have a fever. You have increasing pain in your abdomen that is not relieved with medicine. These symptoms may be an emergency. Get help right away. Call 911. Do not wait to see if the symptoms will go away. Do not drive yourself to the hospital. Summary After the procedure, it is common to have a small amount of blood in your stool. You may also have mild cramping and bloating of your abdomen. If you were given a sedative during the procedure, it can affect you for several hours. Do not drive or operate machinery until your health care provider says that it is safe. Get help right away if you have a lot of blood in your stool, nausea or vomiting, a fever, or increased pain in your abdomen. This information is not intended to replace advice given to you by your health care provider. Make sure you discuss any questions you have with your health care provider. Document Revised: 11/04/2022 Document Reviewed: 05/15/2021 Elsevier Patient Education  2024 [employer]vier Inc.Monitored Anesthesia Care, Care After The  following information offers guidance on how to care for yourself after your procedure. Your health care provider may also give you more specific instructions. If you have problems or questions, contact your health care provider. What can I expect after the procedure? After the procedure, it is common to have: Tiredness. Little or no memory about what happened during or after the procedure. Impaired judgment when it comes to making decisions. Nausea or vomiting. Some trouble with balance. Follow these instructions at home: For the time period you were told by your health care provider:  Rest. Do not participate in activities where you could fall or become injured. Do not drive or use machinery. Do not  drink alcohol. Do not take sleeping pills or medicines that cause drowsiness. Do not make important decisions or sign legal documents. Do not take care of children on your own. Medicines Take over-the-counter and prescription medicines only as told by your health care provider. If you were prescribed antibiotics, take them as told by your health care provider. Do not stop using the antibiotic even if you start to feel better. Eating and drinking Follow instructions from your health care provider about what you may eat and drink. Drink enough fluid to keep your urine pale yellow. If you vomit: Drink clear fluids slowly and in small amounts as you are able. Clear fluids include water, ice chips, low-calorie sports drinks, and fruit juice that has water added to it (diluted fruit juice). Eat light and bland foods in small amounts as you are able. These foods include bananas, applesauce, rice, lean meats, toast, and crackers. General instructions  Have a responsible adult stay with you for the time you are told. It is important to have someone help care for you until you are awake and alert. If you have sleep apnea, surgery and some medicines can increase your risk for breathing problems.  Follow instructions from your health care provider about wearing your sleep device: When you are sleeping. This includes during daytime naps. While taking prescription pain medicines, sleeping medicines, or medicines that make you drowsy. Do not use any products that contain nicotine or tobacco. These products include cigarettes, chewing tobacco, and vaping devices, such as e-cigarettes. If you need help quitting, ask your health care provider. Contact a health care provider if: You feel nauseous or vomit every time you eat or drink. You feel light-headed. You are still sleepy or having trouble with balance after 24 hours. You get a rash. You have a fever. You have redness or swelling around the IV site. Get help right away if: You have trouble breathing. You have new confusion after you get home. These symptoms may be an emergency. Get help right away. Call 911. Do not wait to see if the symptoms will go away. Do not drive yourself to the hospital. This information is not intended to replace advice given to you by your health care provider. Make sure you discuss any questions you have with your health care provider. Document Revised: 02/17/2022 Document Reviewed: 02/17/2022 Elsevier Patient Education  2024 [employer].

## 2023-11-02 ENCOUNTER — Encounter (HOSPITAL_COMMUNITY): Payer: Self-pay

## 2023-11-02 ENCOUNTER — Encounter (HOSPITAL_COMMUNITY)
Admission: RE | Admit: 2023-11-02 | Discharge: 2023-11-02 | Disposition: A | Discharge status: Home / Self Care | Payer: 59 | Source: Ambulatory Visit | Attending: Internal Medicine | Admitting: Internal Medicine

## 2023-11-02 VITALS — BP 128/76 | HR 71 | Temp 97.9°F | Resp 18 | Ht 63.0 in | Wt 306.0 lb

## 2023-11-02 DIAGNOSIS — Z01818 Encounter for other preprocedural examination: Secondary | ICD-10-CM | POA: Insufficient documentation

## 2023-11-02 DIAGNOSIS — I10 Essential (primary) hypertension: Secondary | ICD-10-CM | POA: Diagnosis not present

## 2023-11-02 DIAGNOSIS — Z79899 Other long term (current) drug therapy: Secondary | ICD-10-CM

## 2023-11-02 LAB — BASIC METABOLIC PANEL
Anion gap: 7 (ref 5–15)
BUN: 18 mg/dL (ref 6–20)
CO2: 27 mmol/L (ref 22–32)
Calcium: 8.2 mg/dL — ABNORMAL LOW (ref 8.9–10.3)
Chloride: 104 mmol/L (ref 98–111)
Creatinine, Ser: 0.71 mg/dL (ref 0.44–1.00)
GFR, Estimated: >60 mL/min (ref >60)
Glucose, Bld: 95 mg/dL (ref 70–99)
Potassium: 3.5 mmol/L (ref 3.5–5.1)
Sodium: 138 mmol/L (ref 135–145)

## 2023-11-04 ENCOUNTER — Ambulatory Visit (HOSPITAL_COMMUNITY): Payer: 59 | Admitting: Registered Nurse

## 2023-11-04 ENCOUNTER — Encounter (HOSPITAL_COMMUNITY): Payer: Self-pay | Admitting: Internal Medicine

## 2023-11-04 ENCOUNTER — Ambulatory Visit (HOSPITAL_COMMUNITY)
Admission: RE | Admit: 2023-11-04 | Discharge: 2023-11-04 | Disposition: A | Discharge status: Home / Self Care | Payer: 59 | Source: Ambulatory Visit | Attending: Internal Medicine | Admitting: Internal Medicine

## 2023-11-04 ENCOUNTER — Encounter (HOSPITAL_COMMUNITY)
Admission: RE | Disposition: A | Discharge status: Home / Self Care | Payer: Self-pay | Source: Ambulatory Visit | Attending: Internal Medicine

## 2023-11-04 ENCOUNTER — Other Ambulatory Visit: Payer: Self-pay

## 2023-11-04 DIAGNOSIS — E039 Hypothyroidism, unspecified: Secondary | ICD-10-CM | POA: Insufficient documentation

## 2023-11-04 DIAGNOSIS — Z1211 Encounter for screening for malignant neoplasm of colon: Secondary | ICD-10-CM

## 2023-11-04 DIAGNOSIS — E669 Obesity, unspecified: Secondary | ICD-10-CM | POA: Insufficient documentation

## 2023-11-04 DIAGNOSIS — Q438 Other specified congenital malformations of intestine: Secondary | ICD-10-CM

## 2023-11-04 DIAGNOSIS — Z79890 Hormone replacement therapy (postmenopausal): Secondary | ICD-10-CM | POA: Insufficient documentation

## 2023-11-04 DIAGNOSIS — Z7982 Long term (current) use of aspirin: Secondary | ICD-10-CM | POA: Insufficient documentation

## 2023-11-04 DIAGNOSIS — K6389 Other specified diseases of intestine: Secondary | ICD-10-CM | POA: Insufficient documentation

## 2023-11-04 DIAGNOSIS — I10 Essential (primary) hypertension: Secondary | ICD-10-CM | POA: Insufficient documentation

## 2023-11-04 DIAGNOSIS — E785 Hyperlipidemia, unspecified: Secondary | ICD-10-CM | POA: Diagnosis not present

## 2023-11-04 DIAGNOSIS — Z79899 Other long term (current) drug therapy: Secondary | ICD-10-CM | POA: Insufficient documentation

## 2023-11-04 DIAGNOSIS — Z6841 Body Mass Index (BMI) 40.0 and over, adult: Secondary | ICD-10-CM | POA: Diagnosis not present

## 2023-11-04 DIAGNOSIS — Z83719 Family history of colon polyps, unspecified: Secondary | ICD-10-CM | POA: Diagnosis not present

## 2023-11-04 HISTORY — PX: COLONOSCOPY WITH PROPOFOL: SHX5780

## 2023-11-04 LAB — POCT PREGNANCY, URINE: Preg Test, Ur: NEGATIVE

## 2023-11-04 SURGERY — COLONOSCOPY WITH PROPOFOL
Anesthesia: General

## 2023-11-04 MED ORDER — LACTATED RINGERS IV SOLN: INTRAVENOUS | Status: DC | PRN

## 2023-11-04 MED ORDER — PROPOFOL 10 MG/ML IV BOLUS
INTRAVENOUS | Status: DC | PRN
  Administered 2023-11-04: 50 mg via INTRAVENOUS

## 2023-11-04 MED ORDER — PROPOFOL 500 MG/50ML IV EMUL
INTRAVENOUS | Status: DC | PRN
  Administered 2023-11-04: 100 ug/kg/min via INTRAVENOUS

## 2023-11-04 NOTE — Anesthesia Preprocedure Evaluation (Signed)
Anesthesia Evaluation  Patient identified by MRN, date of birth, ID band Patient awake    Reviewed: Allergy & Precautions, H&P , NPO status , Patient's Chart, lab work & pertinent test results, reviewed documented beta blocker date and time   Airway Mallampati: II  TM Distance: >3 FB Neck ROM: full    Dental no notable dental hx.    Pulmonary neg pulmonary ROS   Pulmonary exam normal breath sounds clear to auscultation       Cardiovascular Exercise Tolerance: Good hypertension, negative cardio ROS  Rhythm:regular Rate:Normal     Neuro/Psych negative neurological ROS  negative psych ROS   GI/Hepatic negative GI ROS, Neg liver ROS,,,  Endo/Other  negative endocrine ROSHypothyroidism    Renal/GU negative Renal ROS  negative genitourinary   Musculoskeletal   Abdominal   Peds  Hematology negative hematology ROS (+)   Anesthesia Other Findings   Reproductive/Obstetrics negative OB ROS                             Anesthesia Physical Anesthesia Plan  ASA: 2  Anesthesia Plan: General   Post-op Pain Management:    Induction:   PONV Risk Score and Plan: Propofol infusion  Airway Management Planned:   Additional Equipment:   Intra-op Plan:   Post-operative Plan:   Informed Consent: I have reviewed the patients History and Physical, chart, labs and discussed the procedure including the risks, benefits and alternatives for the proposed anesthesia with the patient or authorized representative who has indicated his/her understanding and acceptance.     Dental Advisory Given  Plan Discussed with: CRNA  Anesthesia Plan Comments:        Anesthesia Quick Evaluation

## 2023-11-04 NOTE — H&P (Signed)
@LOGO @   Primary Care Physician:  Elfredia Nevins, MD Primary Gastroenterologist:  Dr. Jena Gauss  Pre-Procedure History & Physical: HPI:  Jasmine Wallace is a 53 y.o. female is here for a screening colonoscopy.  First ever examination.  Sister with colon polyps at age 59.  No bowel symptoms.  Past Medical History:  Diagnosis Date   HTN (hypertension)    Hyperlipidemia    Hypothyroidism    Obesity     Past Surgical History:  Procedure Laterality Date   ANKLE SURGERY Right    CHOLECYSTECTOMY     TONSILLECTOMY      Prior to Admission medications   Medication Sig Start Date End Date Taking? Authorizing Provider  aspirin EC 81 MG tablet Take 81 mg by mouth daily. Swallow whole.   Yes [provider]  atorvastatin (LIPITOR) 20 MG tablet SMARTSIG:1 Tablet(s) By Mouth Every Evening 05/28/21  Yes [provider]  Calcium Carbonate (CALCIUM 600 PO) Take 1 tablet by mouth in the morning and at bedtime. Once a day   Yes [provider]  Cholecalciferol (D3 5000) 125 MCG (5000 UT) capsule Take 5,000 Units by mouth daily.   Yes [provider]  hydrochlorothiazide (HYDRODIURIL) 25 MG tablet Take 25 mg by mouth daily. 07/15/21  Yes [provider]  levothyroxine (SYNTHROID) 50 MCG tablet Take 50 mcg by mouth daily before breakfast.   Yes [provider]  losartan (COZAAR) 25 MG tablet Take 25 mg by mouth 2 (two) times daily. 05/13/21  Yes [provider]  magnesium oxide (MAG-OX) 400 (240 Mg) MG tablet Take 400 mg by mouth daily.   Yes [provider]  ondansetron (ZOFRAN-ODT) 4 MG disintegrating tablet Take 4 mg by mouth every 8 (eight) hours as needed. 07/13/23  Yes [provider]  potassium chloride SA (KLOR-CON) 20 MEQ tablet Take 20 mEq by mouth 2 (two) times daily. 07/12/21  Yes [provider]  zinc gluconate 50 MG tablet Take 50 mg by mouth daily.   Yes [provider]  SEMAGLUTIDE,0.25 OR  0.5MG /DOS, [place] 10 units weekly 06/09/23   [provider]    Allergies as of 09/09/2023   (No Known Allergies)    Family History  Problem Relation Age of Onset   Colon polyps Sister 32   Colon cancer Neg Hx     Social History   Socioeconomic History   Marital status: Single    Spouse name: Not on file   Number of children: Not on file   Years of education: Not on file   Highest education level: Not on file  Occupational History   Not on file  Tobacco Use   Smoking status: Never   Smokeless tobacco: Never  Substance and Sexual Activity   Alcohol use: No    Alcohol/week: 0.0 standard drinks of alcohol   Drug use: No   Sexual activity: Yes    Birth control/protection: None  Other Topics Concern   Not on file  Social History Narrative   Not on file   Social Drivers of Health   Financial Resource Strain: Not on file  Food Insecurity: Not on file  Transportation Needs: Not on file  Physical Activity: Not on file  Stress: Not on file  Social Connections: Not on file  Intimate Partner Violence: Not on file    Review of Systems: See HPI, otherwise negative ROS  Physical Exam: BP (!) 148/86   Pulse 70   Temp 98.1 F (36.7 C) (Oral)  Resp 20   Ht 5\' 3"  (1.6 m)   Wt 135.2 kg   SpO2 100%   BMI 52.79 kg/m  General:   Alert,  Well-developed, well-nourished, pleasant and cooperative in NAD Lungs:  Clear throughout to auscultation.   No wheezes, crackles, or rhonchi. No acute distress. Heart:  Regular rate and rhythm; no murmurs, clicks, rubs,  or gallops. Abdomen:  Soft, nontender and nondistended. No masses, hepatosplenomegaly or hernias noted. Normal bowel sounds, without guarding, and without rebound.   Msk:  Symmetrical without gross deformities. Normal posture.  Impression/Plan: Jasmine Wallace is now here to undergo a screening colonoscopy.  Family history of colon polyps.  Risks, benefits, limitations, imponderables and alternatives regarding  colonoscopy have been reviewed with the patient. Questions have been answered. All parties agreeable.     Notice:  This dictation was prepared with Dragon dictation along with smaller phrase technology. Any transcriptional errors that result from this process are unintentional and may not be corrected upon review.

## 2023-11-04 NOTE — Discharge Instructions (Signed)
  Colonoscopy Discharge Instructions  Read the instructions outlined below and refer to this sheet in the next few weeks. These discharge instructions provide you with general information on caring for yourself after you leave the hospital. Your doctor may also give you specific instructions. While your treatment has been planned according to the most current medical practices available, unavoidable complications occasionally occur. If you have any problems or questions after discharge, call Dr. Jena Gauss at [phone]. ACTIVITY You may resume your regular activity, but move at a slower pace for the next 24 hours.  Take frequent rest periods for the next 24 hours.  Walking will help get rid of the air and reduce the bloated feeling in your belly (abdomen).  No driving for 24 hours (because of the medicine (anesthesia) used during the test).   Do not sign any important legal documents or operate any machinery for 24 hours (because of the anesthesia used during the test).  NUTRITION Drink plenty of fluids.  You may resume your normal diet as instructed by your doctor.  Begin with a light meal and progress to your normal diet. Heavy or fried foods are harder to digest and may make you feel sick to your stomach (nauseated).  Avoid alcoholic beverages for 24 hours or as instructed.  MEDICATIONS You may resume your normal medications unless your doctor tells you otherwise.  WHAT YOU CAN EXPECT TODAY Some feelings of bloating in the abdomen.  Passage of more gas than usual.  Spotting of blood in your stool or on the toilet paper.  IF YOU HAD POLYPS REMOVED DURING THE COLONOSCOPY: No aspirin products for 7 days or as instructed.  No alcohol for 7 days or as instructed.  Eat a soft diet for the next 24 hours.  FINDING OUT THE RESULTS OF YOUR TEST Not all test results are available during your visit. If your test results are not back during the visit, make an appointment with your caregiver to find out the  results. Do not assume everything is normal if you have not heard from your caregiver or the medical facility. It is important for you to follow up on all of your test results.  SEEK IMMEDIATE MEDICAL ATTENTION IF: You have more than a spotting of blood in your stool.  Your belly is swollen (abdominal distention).  You are nauseated or vomiting.  You have a temperature over 101.  You have abdominal pain or discomfort that is severe or gets worse throughout the day.    Your colon was norma today.  Repeat colonoscopy in 5  years.

## 2023-11-04 NOTE — Transfer of Care (Signed)
Immediate Anesthesia Transfer of Care Note  Patient: Jasmine Wallace  Procedure(s) Performed: COLONOSCOPY WITH PROPOFOL  Patient Location: PACU  Anesthesia Type:MAC  Level of Consciousness: awake, alert , and oriented  Airway & Oxygen Therapy: Patient Spontanous Breathing  Post-op Assessment: Report given to RN and Post -op Vital signs reviewed and stable  Post vital signs: Reviewed and stable  Last Vitals:  Vitals Value Taken Time  BP    Temp    Pulse 85 11/04/23 0914  Resp    SpO2 95 % 11/04/23 0914  Vitals shown include unfiled device data.  Last Pain:  Vitals:   11/04/23 0846  TempSrc:   PainSc: 0-No pain         Complications: No notable events documented.

## 2023-11-04 NOTE — Op Note (Signed)
[facility] Patient Name: Jasmine Wallace Procedure Date: 11/04/2023 8:33 AM MRN: [id number] Date of Birth: 1970/10/16 Attending MD: Gennette Pac , MD, [id number] CSN: [id number] Age: 53 Admit Type: Outpatient Procedure:                Colonoscopy Indications:              Colon cancer screening in patient at increased                            risk: Family history of 1st-degree relative with                            colon polyps before age 56 years Providers:                Gennette Pac, MD, Buel Ream. Thomasena Edis RN, RN,                            Pandora Leiter, Technician Referring MD:              Medicines:                Propofol per Anesthesia Complications:            No immediate complications. Estimated Blood Loss:     Estimated blood loss: none. Procedure:                Pre-Anesthesia Assessment:                           - Prior to the procedure, a History and Physical                            was performed, and patient medications and                            allergies were reviewed. The patient's tolerance of                            previous anesthesia was also reviewed. The risks                            and benefits of the procedure and the sedation                            options and risks were discussed with the patient.                            All questions were answered, and informed consent                            was obtained. Prior Anticoagulants: The patient has                            taken no anticoagulant or antiplatelet agents. ASA  Grade Assessment: III - A patient with severe                            systemic disease. After reviewing the risks and                            benefits, the patient was deemed in satisfactory                            condition to undergo the procedure.                           After obtaining informed consent, the colonoscope                            was  passed under direct vision. Throughout the                            procedure, the patient's blood pressure, pulse, and                            oxygen saturations were monitored continuously. The                            [id number]) scope was introduced through the                            anus and advanced to the the cecum, identified by                            appendiceal orifice and ileocecal valve. The                            ileocecal valve, appendiceal orifice, and rectum                            were photographed. The colonoscopy was performed                            without difficulty. The patient tolerated the                            procedure well. The quality of the bowel                            preparation was adequate. Scope In: 8:52:24 AM Scope Out: 9:07:03 AM Scope Withdrawal Time: 0 hours 9 minutes 10 seconds  Total Procedure Duration: 0 hours 14 minutes 39 seconds  Findings:      The perianal and digital rectal examinations were normal. Redundant and       elongated colon. Colonic mucosa appeared normal.      The retroflexed view of the distal rectum and anal verge was normal and       showed no anal or rectal abnormalities. Impression:               -  Elongated but normal-appearing colon.                           - No specimens collected. Moderate Sedation:      Moderate (conscious) sedation was personally administered by an       anesthesia professional. The following parameters were monitored: oxygen       saturation, heart rate, blood pressure, respiratory rate, EKG, adequacy       of pulmonary ventilation, and response to care. Recommendation:           - Patient has a contact number available for                            emergencies. The signs and symptoms of potential                            delayed complications were discussed with the                            patient. Return to normal activities tomorrow.                             Written discharge instructions were provided to the                            patient.                           - Advance diet as tolerated.                           - Continue present medications. Procedure Code(s):        --- Professional ---                           [phone], Colonoscopy, flexible; diagnostic, including                            collection of specimen(s) by brushing or washing,                            when performed (separate procedure) Diagnosis Code(s):        --- Professional ---                           Z83.71, Family history of colonic polyps CPT copyright 2022 [employer] Medical Association. All rights reserved. The codes documented in this report are preliminary and upon coder review may  be revised to meet current compliance requirements. Gerrit Friends. Selwyn Reason, MD Gennette Pac, MD 11/04/2023 9:14:37 AM This report has been signed electronically. Number of Addenda: 0

## 2023-11-05 ENCOUNTER — Encounter (HOSPITAL_COMMUNITY): Payer: Self-pay | Admitting: Internal Medicine

## 2023-11-08 NOTE — Anesthesia Postprocedure Evaluation (Signed)
Anesthesia Post Note  Patient: Jasmine Wallace  Procedure(s) Performed: COLONOSCOPY WITH PROPOFOL  Patient location during evaluation: Phase II Anesthesia Type: General Level of consciousness: awake Pain management: pain level controlled Vital Signs Assessment: post-procedure vital signs reviewed and stable Respiratory status: spontaneous breathing and respiratory function stable Cardiovascular status: blood pressure returned to baseline and stable Postop Assessment: no headache and no apparent nausea or vomiting Anesthetic complications: no Comments: Late entry   No notable events documented.   Last Vitals:  Vitals:   11/04/23 0913 11/04/23 0917  BP: (!) 96/45 101/67  Pulse: 84   Resp: 18   Temp: 36.6 C   SpO2: 100%     Last Pain:  Vitals:   11/05/23 1350  TempSrc:   PainSc: 0-No pain                 Windell Norfolk

## 2024-03-29 DIAGNOSIS — I10 Essential (primary) hypertension: Secondary | ICD-10-CM | POA: Diagnosis not present

## 2024-03-29 DIAGNOSIS — Z0001 Encounter for general adult medical examination with abnormal findings: Secondary | ICD-10-CM | POA: Diagnosis not present

## 2024-03-29 DIAGNOSIS — E039 Hypothyroidism, unspecified: Secondary | ICD-10-CM | POA: Diagnosis not present

## 2024-03-29 DIAGNOSIS — R7303 Prediabetes: Secondary | ICD-10-CM | POA: Diagnosis not present

## 2024-05-03 ENCOUNTER — Other Ambulatory Visit (HOSPITAL_COMMUNITY): Payer: Self-pay | Admitting: Family Medicine

## 2024-05-03 DIAGNOSIS — Z1231 Encounter for screening mammogram for malignant neoplasm of breast: Secondary | ICD-10-CM

## 2024-05-23 ENCOUNTER — Ambulatory Visit (HOSPITAL_COMMUNITY)
Admission: RE | Admit: 2024-05-23 | Discharge: 2024-05-23 | Disposition: A | Discharge status: Home / Self Care | Source: Ambulatory Visit | Attending: Family Medicine | Admitting: Family Medicine

## 2024-05-23 DIAGNOSIS — Z1231 Encounter for screening mammogram for malignant neoplasm of breast: Secondary | ICD-10-CM | POA: Diagnosis not present

## 2024-10-17 ENCOUNTER — Other Ambulatory Visit (HOSPITAL_COMMUNITY): Payer: Self-pay

## 2024-10-18 ENCOUNTER — Other Ambulatory Visit (HOSPITAL_COMMUNITY): Payer: Self-pay

## 2024-10-18 ENCOUNTER — Other Ambulatory Visit: Payer: Self-pay

## 2024-10-18 MED ORDER — HYDROCHLOROTHIAZIDE 25 MG PO TABS
25.0000 mg | ORAL_TABLET | Freq: Every day | ORAL | 2 refills | Status: AC
  Filled 2024-10-18 – 2024-10-31 (×3): qty 90, 90d supply, fill #0

## 2024-10-18 MED ORDER — LOSARTAN POTASSIUM 50 MG PO TABS: 50.0000 mg | ORAL_TABLET | Freq: Every day | ORAL | 3 refills | Status: AC

## 2024-10-18 MED ORDER — ATORVASTATIN CALCIUM 20 MG PO TABS
20.0000 mg | ORAL_TABLET | Freq: Every evening | ORAL | 2 refills | Status: AC
  Filled 2024-10-18: qty 90, 90d supply, fill #0

## 2024-10-18 MED ORDER — POTASSIUM CHLORIDE CRYS ER 20 MEQ PO TBCR
20.0000 meq | EXTENDED_RELEASE_TABLET | Freq: Two times a day (BID) | ORAL | 2 refills | Status: AC
  Filled 2024-10-18 – 2024-10-31 (×3): qty 180, 90d supply, fill #0

## 2024-10-18 MED ORDER — LOSARTAN POTASSIUM 50 MG PO TABS
50.0000 mg | ORAL_TABLET | Freq: Every day | ORAL | 2 refills | Status: AC
  Filled 2024-10-18: qty 90, 90d supply, fill #0

## 2024-10-18 MED ORDER — ATORVASTATIN CALCIUM 20 MG PO TABS
20.0000 mg | ORAL_TABLET | Freq: Every evening | ORAL | 2 refills | Status: AC
  Filled 2024-10-25 – 2024-10-31 (×2): qty 60, 60d supply, fill #0

## 2024-10-18 MED ORDER — LEVOTHYROXINE SODIUM 50 MCG PO TABS
50.0000 ug | ORAL_TABLET | Freq: Every day | ORAL | 2 refills | Status: AC
  Filled 2024-10-18 – 2024-10-31 (×4): qty 60, 60d supply, fill #0

## 2024-10-19 ENCOUNTER — Other Ambulatory Visit: Payer: Self-pay

## 2024-10-19 ENCOUNTER — Encounter: Payer: Self-pay | Admitting: Pharmacist

## 2024-10-25 ENCOUNTER — Other Ambulatory Visit: Payer: Self-pay

## 2024-10-25 ENCOUNTER — Other Ambulatory Visit (HOSPITAL_COMMUNITY): Payer: Self-pay

## 2024-10-25 ENCOUNTER — Encounter: Payer: Self-pay | Admitting: Pharmacist

## 2024-10-28 ENCOUNTER — Other Ambulatory Visit: Payer: Self-pay

## 2024-10-31 ENCOUNTER — Other Ambulatory Visit (HOSPITAL_COMMUNITY): Payer: Self-pay

## 2024-11-01 ENCOUNTER — Other Ambulatory Visit: Payer: Self-pay
